# Patient Record
Sex: Female | Born: 1990
Health system: Southern US, Community
[De-identification: ages and names within clinical notes are randomized; demographics above are authoritative.]

## PROBLEM LIST (undated history)

## (undated) DIAGNOSIS — Z1379 Encounter for other screening for genetic and chromosomal anomalies: Principal | ICD-10-CM

## (undated) DIAGNOSIS — L709 Acne, unspecified: Secondary | ICD-10-CM

## (undated) HISTORY — DX: Encounter for other screening for genetic and chromosomal anomalies: Z13.79

## (undated) HISTORY — DX: Acne, unspecified: L70.9

---
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
## 2009-11-05 ENCOUNTER — Encounter: Payer: Self-pay | Admitting: Family Medicine

## 2009-11-06 ENCOUNTER — Encounter (INDEPENDENT_AMBULATORY_CARE_PROVIDER_SITE_OTHER): Payer: Self-pay | Admitting: *Deleted

## 2009-11-06 LAB — CONVERTED CEMR LAB
Albumin: 4.2 g/dL
Alkaline Phosphatase: 44 units/L
B-12, serum: 556 pg/mL
Creatinine, Ser: 0.69 mg/dL
LDL Cholesterol: 149 mg/dL
MCH: 28.6 pg
MCV: 87.6 fL
Platelets: 293 10*3/uL
Potassium, serum: 4 mmol/L
RBC count: 4.2 10*6/uL
Total Protein: 7.3 g/dL
Triglycerides: 91 mg/dL

## 2010-01-24 ENCOUNTER — Encounter (INDEPENDENT_AMBULATORY_CARE_PROVIDER_SITE_OTHER): Payer: Self-pay | Admitting: *Deleted

## 2010-01-24 ENCOUNTER — Ambulatory Visit: Payer: Self-pay | Admitting: Family Medicine

## 2010-01-24 DIAGNOSIS — M25559 Pain in unspecified hip: Secondary | ICD-10-CM | POA: Insufficient documentation

## 2010-01-24 DIAGNOSIS — E663 Overweight: Secondary | ICD-10-CM | POA: Insufficient documentation

## 2010-01-24 DIAGNOSIS — E785 Hyperlipidemia, unspecified: Secondary | ICD-10-CM | POA: Insufficient documentation

## 2010-01-28 ENCOUNTER — Telehealth: Payer: Self-pay | Admitting: Family Medicine

## 2010-01-29 ENCOUNTER — Telehealth (INDEPENDENT_AMBULATORY_CARE_PROVIDER_SITE_OTHER): Payer: Self-pay | Admitting: *Deleted

## 2010-01-29 LAB — CONVERTED CEMR LAB
ALT: 44 units/L — ABNORMAL HIGH (ref 0–35)
Albumin: 3.9 g/dL (ref 3.5–5.2)
Alkaline Phosphatase: 41 units/L (ref 39–117)
Basophils Absolute: 0 10*3/uL (ref 0.0–0.1)
Basophils Relative: 0.4 % (ref 0.0–3.0)
Bilirubin, Direct: 0.1 mg/dL (ref 0.0–0.3)
CO2: 27 meq/L (ref 19–32)
Calcium: 9.2 mg/dL (ref 8.4–10.5)
Chloride: 106 meq/L (ref 96–112)
Cholesterol: 173 mg/dL (ref 0–200)
Creatinine, Ser: 0.8 mg/dL (ref 0.4–1.2)
Eosinophils Relative: 0.8 % (ref 0.0–5.0)
Glucose, Bld: 90 mg/dL (ref 70–99)
HCT: 35.9 % — ABNORMAL LOW (ref 36.0–46.0)
Hemoglobin: 12.4 g/dL (ref 12.0–15.0)
Lymphocytes Relative: 34.5 % (ref 12.0–46.0)
Lymphs Abs: 2.3 10*3/uL (ref 0.7–4.0)
Monocytes Relative: 8.9 % (ref 3.0–12.0)
Neutro Abs: 3.6 10*3/uL (ref 1.4–7.7)
RBC: 4 M/uL (ref 3.87–5.11)
TSH: 3.51 microintl units/mL (ref 0.35–5.50)
VLDL: 12.4 mg/dL (ref 0.0–40.0)
WBC: 6.5 10*3/uL (ref 4.5–10.5)

## 2010-02-06 ENCOUNTER — Ambulatory Visit: Payer: Self-pay | Admitting: Sports Medicine

## 2010-02-12 ENCOUNTER — Ambulatory Visit: Payer: Self-pay | Admitting: Family Medicine

## 2010-02-13 LAB — CONVERTED CEMR LAB
ALT: 20 units/L (ref 0–35)
Albumin: 4.1 g/dL (ref 3.5–5.2)
Bilirubin, Direct: 0.1 mg/dL (ref 0.0–0.3)
Total Protein: 6.8 g/dL (ref 6.0–8.3)

## 2010-02-21 ENCOUNTER — Ambulatory Visit: Payer: Self-pay | Admitting: Family Medicine

## 2010-10-02 NOTE — Assessment & Plan Note (Signed)
Summary: Snapping Hip Syndrome   Vital Signs:  Patient profile:   20 year old female BP sitting:   115 / 82  Vitals Entered By: Lillia Pauls CMA (February 06, 2010 10:14 AM)  Primary Care Provider:  Laury Axon   History of Present Illness: 20 yo F referred by Dr. Beverely Low for R > L anterior hip pain  Patient reports main complaint is  ~2 months of snapping/popping feeling and sound in R > L anterior hips Occasionally is painful in this area Feels mainly when doing leg raises and abdominal workouts Is not currently playing any sports. Occasionally has low back pain but no numbness/tingling No bowel/bladder dysfunction. Not limping because of pain No locking up of her hip.  Allergies (verified): 1)  ! Biaxin  Physical Exam  General:  Well-developed,well-nourished,in no acute distress; alert,appropriate and cooperative throughout examination Msk:  Back: No gross deformity, swelling, bruising. No midline or paraspinal TTP. Negative SLRs bilaterally Strength 5-/5 with hip flexion R, 5/5 on L;  4/5 with hip abduction R/L; 4/5 with sartorius R/L;  otherwise 5/5 strength BLEs other muscle groups Sensation intact to light touch MSRs 2+ and equal in bilateral patellar and achilles tendons.  Hips: No gross deformity. No TTP greater trochanter, area of iliopsoas muscle. FROM bilateral hips. Strength as noted above. Negative logrolls No leg length inequality. Negative Fabers and piriformis stretches. Palpable 'snap' with hip flexion when lying.  Could not reproduce with standing hip rotation exercise.   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  No injury or mechanical symptoms to suggest labral pathology.  Strengthening exercises given and emphasized - pain is not much of an issue for her currently but can try aleve twice a day if it does become more frequent.  Consider PT if worsening despite home exercises.   Reassured regarding the diagnosis and handout provided.  F/u in 6 weeks.  Complete Medication List: 1)  Tri-sprintec 0.035 Mg Tabs (Norgestimate-ethinyl estradiol) 2)  Zyrtec Allergy 10 Mg Tabs (Cetirizine hcl) .Marland Kitchen.. 1 by mouth once daily prn 3)  Ventolin Hfa 108 (90 Base) Mcg/act Aers (Albuterol sulfate) .Marland Kitchen.. 1-2 puffs every 4 hours as needed shortness of breath  Patient Instructions: 1)  You have snapping hip syndrome. 2)  If you are having pain because of this, take aleve 2 tabs twice a day with food. 3)  The major treatment for this is strengthening and stretching. 4)  Straight leg raises 3 sets of 10 once a day - when too easy you can add a 2 pound ankle weight. 5)  Hip rotation exercise 3 sets of 10 also and can add weight if too easy. 6)  You are weak in your hip abductors so do this exercise in the same manner as above. 7)  Follow up with [place] in 6 weeks for a recheck.

## 2010-10-02 NOTE — Miscellaneous (Signed)
Summary: labs from [facility]  Clinical Lists Changes  Observations: Added new observation of B-12: 556 pg/mL (11/06/2009 8:55) Added new observation of TSH: 1.095 microintl units/mL (11/06/2009 8:55) Added new observation of TRIGLYC TOT: 91 mg/dL (53/66/4403 4:74) Added new observation of LDL: 149 mg/dL (25/95/6387 5:64) Added new observation of HDL: 56 mg/dL (33/29/5188 4:16) Added new observation of CHOLESTEROL: 223 mg/dL (60/63/0160 1:09) Added new observation of BILI TOTAL: 0.4 mg/dL (32/35/5732 2:02) Added new observation of ALK PHOS: 44 units/L (11/06/2009 8:55) Added new observation of SGPT (ALT): 16 units/L (11/06/2009 8:55) Added new observation of SGOT (AST): 18 units/L (11/06/2009 8:55) Added new observation of PROTEIN, TOT: 7.3 g/dL (54/27/0623 7:62) Added new observation of ALBUMIN: 4.2 g/dL (83/15/1761 6:07) Added new observation of CALCIUM: 9.0 mg/dL (37/05/6268 4:85) Added new observation of GLUCOSE SER: 82 mg/dL (46/27/0350 0:93) Added new observation of CREATININE: 0.69 mg/dL (81/82/9937 1:69) Added new observation of BUN: 9 mg/dL (67/89/3810 1:75) Added new observation of CO2 TOTAL: 28 mmol/L (11/06/2009 8:55) Added new observation of CHLORIDE: 101 mmol/L (11/06/2009 8:55) Added new observation of POTASSIUM: 4.0 mmol/L (11/06/2009 8:55) Added new observation of SODIUM: 137 mmol/L (11/06/2009 8:55) Added new observation of PLATELETS: 293 10*3/mm3 (11/06/2009 8:55) Added new observation of MCH: 28.6 pg (11/06/2009 8:55) Added new observation of MCV: 87.6 fL (11/06/2009 8:55) Added new observation of HCT: 36.8 % (11/06/2009 8:55) Added new observation of HGB: 12.0 g/dL (06/24/8526 7:82) Added new observation of RBC: 4.20 10*6/mm3 (11/06/2009 8:55) Added new observation of WBC: 6.7 10*3/mm3 (11/06/2009 8:55)

## 2010-10-02 NOTE — Progress Notes (Signed)
Summary: labs-unable to leave msg  Phone Note Outgoing Call   Call placed by: Doristine Devoid,  January 29, 2010 9:33 AM Call placed to: Patient Summary of Call: cholesterol is much improved but liver enzymes high.  hold all tylenol, ETOH, and red yeast rice for 2 weeks and recheck.  rest of labs look good  Follow-up for Phone Call        unable to leave msg will try again later.......Marland KitchenDoristine Devoid  January 29, 2010 9:34 AM      Appended Document: labs-    Phone Note Outgoing Call   Summary of Call: spoke w/ patient mom aware of labs

## 2010-10-02 NOTE — Assessment & Plan Note (Signed)
Summary: rto 4 weeks/cbs   Vital Signs:  Patient profile:   20 year old female Weight:      138 pounds Pulse rate:   80 / minute BP sitting:   108 / 60  (left arm)  Vitals Entered By: Doristine Devoid (February 21, 2010 1:54 PM) CC: f/u on weight loss    History of Present Illness: 20 yo girl here today for f/u on wt loss.  has lost 3 lbs in the last month.  has not been exercising regularly b/c they moved last week.  was working out 4x/week prior to that.  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  realized she is not getting enough fruits and vegetables.  adequate calcium.  pt is not overly concerned about her wt.  Current Medications (verified): 1)  Tri-Sprintec 0.035 Mg  Tabs (Norgestimate-Ethinyl Estradiol) 2)  Zyrtec Allergy 10 Mg  Tabs (Cetirizine Hcl) .Marland Kitchen.. 1 By Mouth Once Daily Prn 3)  Ventolin Hfa 108 (90 Base) Mcg/act  Aers (Albuterol Sulfate) .Marland Kitchen.. 1-2 Puffs Every 4 Hours As Needed Shortness of Breath  Allergies (verified): 1)  ! 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 Alert and cooperative with normal attention span and concentration. No apparent delusions, illusions, hallucinations   Impression & Recommendations:  Problem # 1:  OVERWEIGHT (ICD-278.02) Assessment Unchanged pt has lost 3 lbs w/ regular exercise and healthier food choices.  she is not overly concerned at this time.  has healthy habits for the most part and keeping her food journal is helping her understand her nutritional deficiences.  will follow.  Complete Medication List: 1)  Tri-sprintec 0.035 Mg Tabs (Norgestimate-ethinyl estradiol) 2)  Zyrtec  Allergy 10 Mg Tabs (Cetirizine hcl) .Marland Kitchen.. 1 by mouth once daily prn 3)  Ventolin Hfa 108 (90 Base) Mcg/act Aers (Albuterol sulfate) .Marland Kitchen.. 1-2 puffs every 4 hours as needed shortness of breath  Patient Instructions: 1)  Schedule a follow up as needed 2)  Keep up the good work on exercise and continue to make healthy food choices 3)  Good luck with your classes 4)  Enjoy your summer!

## 2010-10-02 NOTE — Assessment & Plan Note (Signed)
Summary: R  hip popping/cbs   Vital Signs:  Patient profile:   20 year old female Height:      62.5 inches Weight:      141 pounds BMI:     22.40 Pulse rate:   68 / minute BP sitting:   112 / 70  (left arm)  Vitals Entered By: Doristine Devoid (Jan 24, 2010 8:05 AM) CC: R hip popping w/ exercise and recheck cholesterol    History of Present Illness: 20 yo woman here today w/ R hip popping.  sxs started 'several months ago'.  pt can both hear and feel the 'pop'.  located more anteriorly.  doesn't feel like it actually is out of place, more like a 'slipping'.  some discomfort but pt denies a lot of pain.  happens every time pt exercises, can occur while walking or turning over in bed.  hypercholesterolemia- due for recheck.  has been taking red yeast rice since March when elevated cholesterol was identified during school physical.  Overweight- has gained nearly 20 lbs in the 2 yrs since going to college.  pt exercising 2-3x/week for 45 minutes.  eating breakfast regularly, will occasionally skip lunch.  1-2 snacks daily- chips, bananas, oranges.  not drinking ETOH, denies late night eating.  mom concerned for pt's thyroid although normal at march CPE.  Preventive Screening-Counseling & Management  Alcohol-Tobacco     Alcohol drinks/day: 0     Smoking Status: never  Caffeine-Diet-Exercise     Does Patient Exercise: yes      Drug Use:  never.    Current Medications (verified): 1)  Tri-Sprintec 0.035 Mg  Tabs (Norgestimate-Ethinyl Estradiol) 2)  Zyrtec Allergy 10 Mg  Tabs (Cetirizine Hcl) .Marland Kitchen.. 1 By Mouth Once Daily Prn 3)  Ventolin Hfa 108 (90 Base) Mcg/act  Aers (Albuterol Sulfate) .Marland Kitchen.. 1-2 Puffs Every 4 Hours As Needed Shortness of Breath  Allergies (verified): 1)  ! 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 Lungs:  CTA B Heart:  RRR without murmur  Msk:  full ROM of R hip.  no pain w/ flexion/extension/internal or external rotation some asymmetry of SI alignment, ? leg length inequality Pulses:  +2 carotid, radial, DP Extremities:  no CCE Neurologic:  strength normal in all extremities, gait normal, and DTRs symmetrical and normal.     Impression & Recommendations:  Problem # 1:  HIP PAIN (ICD-719.45) Assessment New pt w/out pain on hip exam.  ? leg length inequality vs SI malalignment.  no current pain.  refer to sports med. Orders: Sports Medicine (Sports Med)  Problem # 2:  HYPERLIPIDEMIA (ICD-272.4) Assessment: New labs elevated at recent physical.  has been taking red yeast rice.  due for recheck. Orders: Venipuncture (16109) TLB-Lipid Panel (80061-LIPID) TLB-Hepatic/Liver Function Pnl (80076-HEPATIC)  Problem # 3:  OVERWEIGHT (ICD-278.02) Assessment: New pt upset about current wt.  has been exercising 2x/week.  encouraged her to increase this to at least 4-5x/week.  recommended at least 1 day off.  check thyroid to r/o abnormality.  encouraged pt  to keep food diary to determine where she is stumbling.  will follow. Orders: TLB-TSH (Thyroid Stimulating Hormone) (84443-TSH) TLB-CBC Platelet - w/Differential (85025-CBCD) TLB-BMP (Basic Metabolic Panel-BMET) (80048-METABOL)  Complete Medication List: 1)  Tri-sprintec 0.035 Mg Tabs (Norgestimate-ethinyl estradiol) 2)  Zyrtec Allergy 10 Mg Tabs (Cetirizine hcl) .Marland Kitchen.. 1 by mouth once daily prn 3)  Ventolin Hfa 108 (90 Base) Mcg/act Aers (Albuterol sulfate) .Marland Kitchen.. 1-2 puffs  every 4 hours as needed shortness of breath  Patient Instructions: 1)  Please schedule a follow-up appointment in 3-4 weeks to discuss weight loss.  2)  Try and increase your amount of exercise to 4-5x/week 3)  Keep a food journal- write down EVERYTHING you put in your mouth 4)  Someone will call you with your sports med referral 5)  We'll notify you of your lab results 6)  Have a great summer!!

## 2010-10-02 NOTE — Progress Notes (Signed)
----   Converted from flag ---- ---- 01/28/2010 3:49 PM, Neena Rhymes MD wrote: yes, that's fine  ---- 01/28/2010 2:19 PM, Magdalen Spatz [facility] wrote: Johnny Bridge from Dr. Darrick Penna office just called, says Dr. Darrick Penna will be out for 6 weeks, and is booking into July.  Is it Ok for patient to see Dr. Norton Blizzard? ------------------------------

## 2011-03-13 ENCOUNTER — Encounter: Payer: Self-pay | Admitting: Family Medicine

## 2011-03-23 ENCOUNTER — Ambulatory Visit (INDEPENDENT_AMBULATORY_CARE_PROVIDER_SITE_OTHER): Payer: Managed Care, Other (non HMO) | Admitting: Family Medicine

## 2011-03-23 ENCOUNTER — Encounter: Payer: Self-pay | Admitting: Family Medicine

## 2011-03-23 ENCOUNTER — Other Ambulatory Visit (HOSPITAL_COMMUNITY)
Admission: RE | Admit: 2011-03-23 | Discharge: 2011-03-23 | Disposition: A | Discharge status: Home / Self Care | Payer: Managed Care, Other (non HMO) | Source: Ambulatory Visit | Attending: Family Medicine | Admitting: Family Medicine

## 2011-03-23 DIAGNOSIS — Z0000 Encounter for general adult medical examination without abnormal findings: Secondary | ICD-10-CM

## 2011-03-23 DIAGNOSIS — Z124 Encounter for screening for malignant neoplasm of cervix: Secondary | ICD-10-CM

## 2011-03-23 DIAGNOSIS — Z01419 Encounter for gynecological examination (general) (routine) without abnormal findings: Secondary | ICD-10-CM | POA: Insufficient documentation

## 2011-03-23 MED ORDER — NORGESTIM-ETH ESTRAD TRIPHASIC 0.18/0.215/0.25 MG-35 MCG PO TABS: 1.0000 | ORAL_TABLET | Freq: Every day | ORAL | Status: DC

## 2011-03-23 NOTE — Progress Notes (Signed)
  Subjective:    Patient ID: Stacie Rosario, female    DOB: 04-May-1991, 20 y.o.   MRN: [id number]  HPI Here today for CPE.  No concerns today.   Review of Systems Patient reports no vision/ hearing changes, adenopathy,fever, weight change,  persistant/recurrent hoarseness , swallowing issues, chest pain, palpitations, edema, persistant/recurrent cough, hemoptysis, dyspnea (rest/exertional/paroxysmal nocturnal), gastrointestinal bleeding (melena, rectal bleeding), abdominal pain, significant heartburn, bowel changes, GU symptoms (dysuria, hematuria, incontinence), Gyn symptoms (abnormal  bleeding, pain),  syncope, focal weakness, memory loss, numbness & tingling, skin/hair/nail changes, abnormal bruising or bleeding, anxiety, or depression.     Objective:   Physical Exam  General Appearance:    Alert, cooperative, no distress, appears stated age  Head:    Normocephalic, without obvious abnormality, atraumatic  Eyes:    PERRL, conjunctiva/corneas clear, EOM's intact, fundi    benign, both eyes  Ears:    Normal TM's and external ear canals, both ears  Nose:   Nares normal, septum midline, mucosa normal, no drainage    or sinus tenderness  Throat:   Lips, mucosa, and tongue normal; teeth and gums normal  Neck:   Supple, symmetrical, trachea midline, no adenopathy;    Thyroid: no enlargement/tenderness/nodules  Back:     Symmetric, no curvature, ROM normal, no CVA tenderness  Lungs:     Clear to auscultation bilaterally, respirations unlabored  Chest Wall:    No tenderness or deformity   Heart:    Regular rate and rhythm, S1 and S2 normal, no murmur, rub   or gallop  Breast Exam:    No tenderness, masses, or nipple abnormality  Abdomen:     Soft, non-tender, bowel sounds active all four quadrants,    no masses, no organomegaly  Genitalia:    External genitalia normal, cervix normal in appearance, no CMT, uterus in normal size and position, adnexa w/out mass or tenderness, mucosa pink and  moist, no lesions or discharge present  Rectal:    Normal external appearance  Extremities:   Extremities normal, atraumatic, no cyanosis or edema  Pulses:   2+ and symmetric all extremities  Skin:   Skin color, texture, turgor normal, no rashes or lesions  Lymph nodes:   Cervical, supraclavicular, and axillary nodes normal  Neurologic:   CNII-XII intact, normal strength, sensation and reflexes    throughout          Assessment & Plan:

## 2011-03-23 NOTE — Patient Instructions (Signed)
Follow up in 1 year or as needed Your exam looks great!  Keep up the good work! Call with any questions or concerns Stacie Rosario w/ School!

## 2011-03-23 NOTE — Assessment & Plan Note (Signed)
Pap collected. 

## 2011-03-23 NOTE — Assessment & Plan Note (Signed)
Pt's PE WNL.  Anticipatory guidance provided.  

## 2011-05-15 ENCOUNTER — Encounter: Payer: Self-pay | Admitting: Family Medicine

## 2011-05-15 ENCOUNTER — Ambulatory Visit (INDEPENDENT_AMBULATORY_CARE_PROVIDER_SITE_OTHER): Payer: Managed Care, Other (non HMO) | Admitting: Family Medicine

## 2011-05-15 DIAGNOSIS — R110 Nausea: Secondary | ICD-10-CM | POA: Insufficient documentation

## 2011-05-15 DIAGNOSIS — F411 Generalized anxiety disorder: Secondary | ICD-10-CM

## 2011-05-15 DIAGNOSIS — R109 Unspecified abdominal pain: Secondary | ICD-10-CM

## 2011-05-15 DIAGNOSIS — R002 Palpitations: Secondary | ICD-10-CM

## 2011-05-15 DIAGNOSIS — F419 Anxiety disorder, unspecified: Secondary | ICD-10-CM | POA: Insufficient documentation

## 2011-05-15 LAB — POCT URINALYSIS DIPSTICK
Ketones, UA: NEGATIVE
Leukocytes, UA: NEGATIVE
Protein, UA: NEGATIVE
Spec Grav, UA: 1.01
Urobilinogen, UA: 0.2
pH, UA: 7

## 2011-05-15 LAB — CBC WITH DIFFERENTIAL/PLATELET
Basophils Absolute: 0 10*3/uL (ref 0.0–0.1)
Basophils Relative: 0 % (ref 0–1)
Eosinophils Absolute: 0 10*3/uL (ref 0.0–0.7)
Eosinophils Relative: 0 % (ref 0–5)
HCT: 38.9 % (ref 36.0–46.0)
Hemoglobin: 12.7 g/dL (ref 12.0–15.0)
Lymphocytes Relative: 34 % (ref 12–46)
Lymphs Abs: 2 10*3/uL (ref 0.7–4.0)
MCH: 29.5 pg (ref 26.0–34.0)
MCHC: 32.6 g/dL (ref 30.0–36.0)
MCV: 90.3 fL (ref 78.0–100.0)
Monocytes Absolute: 0.5 10*3/uL (ref 0.1–1.0)
Monocytes Relative: 8 % (ref 3–12)
Neutro Abs: 3.4 10*3/uL (ref 1.7–7.7)
Neutrophils Relative %: 58 % (ref 43–77)
Platelets: 245 10*3/uL (ref 150–400)
RBC: 4.31 MIL/uL (ref 3.87–5.11)
RDW: 13.5 % (ref 11.5–15.5)
WBC: 5.9 10*3/uL (ref 4.0–10.5)

## 2011-05-15 LAB — BASIC METABOLIC PANEL
BUN: 11 mg/dL (ref 6–23)
CO2: 20 mEq/L (ref 19–32)
Calcium: 9.3 mg/dL (ref 8.4–10.5)
Chloride: 103 mEq/L (ref 96–112)
Creat: 0.63 mg/dL (ref 0.50–1.10)
Glucose, Bld: 75 mg/dL (ref 70–99)
Potassium: 4.1 mEq/L (ref 3.5–5.3)
Sodium: 137 mEq/L (ref 135–145)

## 2011-05-15 LAB — TSH: TSH: 1.046 u[IU]/mL (ref 0.350–4.500)

## 2011-05-15 LAB — HEPATIC FUNCTION PANEL
Bilirubin, Direct: 0.1 mg/dL (ref 0.0–0.3)
Total Bilirubin: 0.3 mg/dL (ref 0.3–1.2)

## 2011-05-15 LAB — POCT URINE PREGNANCY: Preg Test, Ur: NEGATIVE

## 2011-05-15 MED ORDER — ESCITALOPRAM OXALATE 10 MG PO TABS: 10.0000 mg | ORAL_TABLET | Freq: Every day | ORAL | Status: DC

## 2011-05-15 MED ORDER — OMEPRAZOLE MAGNESIUM 20 MG PO TBEC: 20.0000 mg | DELAYED_RELEASE_TABLET | Freq: Every day | ORAL | Status: DC

## 2011-05-15 NOTE — Assessment & Plan Note (Signed)
lexapro 10 mg 1 po qd  #30   rto 1 month or sooner prn

## 2011-05-15 NOTE — Progress Notes (Signed)
  Subjective:    Patient ID: Stacie Rosario, female    DOB: April 22, 1991, 20 y.o.   MRN: [id number]  HPI Pt here c/o nausea, insomnia and anxiety.  Pt will feel like her heart is racing and skips a beat.  No uri symptoms.  Nausea is worse at night.     Review of Systems    as above Objective:   Physical Exam  Constitutional: She is oriented to person, place, and time. She appears well-developed and well-nourished.  HENT:  Head: Normocephalic and atraumatic.  Right Ear: External ear normal.  Left Ear: External ear normal.  Nose: Nose normal.  Mouth/Throat: Oropharynx is clear and moist.  Neck: Normal range of motion. Neck supple.  Cardiovascular: Normal rate, regular rhythm and normal heart sounds.   Pulmonary/Chest: Effort normal and breath sounds normal. No respiratory distress. She has no wheezes. She has no rales. She exhibits no tenderness.  Abdominal: Soft. Bowel sounds are normal. She exhibits no distension. There is no tenderness. There is no rebound.  Musculoskeletal: Normal range of motion. She exhibits no edema.  Neurological: She is alert and oriented to person, place, and time.  Skin: Skin is warm and dry.  Psychiatric: She has a normal mood and affect. Her behavior is normal. Judgment and thought content normal. Cognition and memory are normal.       Pt states she has been feeling anxious lately.  It started when school started. She is home for the weekend and feels better.  She was still nauseous yesterday.          Assessment & Plan:

## 2011-05-15 NOTE — Patient Instructions (Signed)
Anxiety and Panic Attacks Your caregiver has informed you that you are having an anxiety or panic attack. There may be many forms of this. Most of the time these attacks come suddenly and without warning. They come at any time of day, including periods of sleep, and at any time of life. They may be strong and unexplained. Although panic attacks are very scary, they are physically harmless. Sometimes the cause of your anxiety is not known. Anxiety is a protective mechanism of the body in its fight or flight mechanism. Most of these perceived danger situations are actually nonphysical situations (such as anxiety over losing a job). CAUSES The causes of an anxiety or panic attack are many. Panic attacks may occur in otherwise healthy people given a certain set of circumstances. There may be a genetic cause for panic attacks. Some medications may also have anxiety as a side effect. SYMPTOMS Some of the most common feelings are:  Intense terror.  Dizziness, feeling faint.   Hot and cold flashes.   Fear of going crazy.   Feelings that nothing is real.   Sweating.   Shaking.   Chest pain or a fast heartbeat (palpitations).  Smothering, choking sensations.   Feelings of impending doom and that death is near.   Tingling of extremities, this may be from over breathing.   Altered reality (derealization).   Being detached from yourself (depersonalization).   Several symptoms can be present to make up anxiety or panic attacks. DIAGNOSIS The evaluation by your caregiver will depend on the type of symptoms you are experiencing. The diagnosis of anxiety or pain attack is made when no physical illness can be determined to be a cause of the symptoms. TREATMENT Treatment to prevent anxiety and panic attacks may include:  Avoidance of circumstances that cause anxiety.   Reassurance and relaxation.   Regular exercise.   Relaxation therapies, such as yoga.   Psychotherapy with a psychiatrist  or therapist.   Avoidance of caffeine, alcohol and illegal drugs.   Prescribed medication.  SEEK IMMEDIATE MEDICAL CARE IF:  You experience panic attack symptoms that are different than your usual symptoms.   You have any worsening or concerning symptoms.  Document Released: 08/17/2005 Document Re-Released: 02/04/2010 [employer] Patient Information 2011 [employer]. 

## 2011-05-15 NOTE — Assessment & Plan Note (Signed)
prilosec otc daily May be related to anxiety

## 2011-05-18 ENCOUNTER — Telehealth: Payer: Self-pay

## 2011-05-18 MED ORDER — ALPRAZOLAM 0.25 MG PO TABS: 0.2500 mg | ORAL_TABLET | Freq: Three times a day (TID) | ORAL | Status: DC | PRN

## 2011-05-18 NOTE — Telephone Encounter (Signed)
Xanax  0.25 mg  1 po tid prn ---#30  0 refills

## 2011-05-18 NOTE — Telephone Encounter (Signed)
Rx faxed--detailed message left advising patient that this is an prn needed medication only      KP

## 2011-05-18 NOTE — Telephone Encounter (Signed)
Call from [place] and she stated patient was started on Lexapro and she was told it would take 2 weeks to 2 mos to take affect and the patient was told to call if she needed something temporary to help her through. She stated she would like to go ahead and get an RX for something temporary until the Lexaparo kicks in. Please advise     KP

## 2011-07-22 ENCOUNTER — Ambulatory Visit (INDEPENDENT_AMBULATORY_CARE_PROVIDER_SITE_OTHER): Payer: Managed Care, Other (non HMO) | Admitting: Internal Medicine

## 2011-07-22 ENCOUNTER — Encounter: Payer: Self-pay | Admitting: Internal Medicine

## 2011-07-22 ENCOUNTER — Ambulatory Visit: Payer: Managed Care, Other (non HMO) | Admitting: Internal Medicine

## 2011-07-22 VITALS — BP 102/60 | HR 83 | Temp 97.9°F | Resp 16 | Ht 62.0 in | Wt 136.0 lb

## 2011-07-22 DIAGNOSIS — R110 Nausea: Secondary | ICD-10-CM

## 2011-07-22 DIAGNOSIS — M549 Dorsalgia, unspecified: Secondary | ICD-10-CM

## 2011-07-22 NOTE — Assessment & Plan Note (Signed)
Change omeprazole to nexium 40mg  po qd-samples given. Schedule f/u. Consider ruq [place] if no improvement

## 2011-07-22 NOTE — Progress Notes (Signed)
  Subjective:    Patient ID: Stacie Rosario, female    DOB: [birth year], 20 y.o.   MRN: [id number]  HPI Pt presents to clinic for evaluation of nausea. Notes several month h/o intermittent nausea not associated with abdominal pain or emesis. Has attempted low dose ppi with some possible improvement. Nausea typically occurs post-prandially. Reviewed nl cbc, chem7 and lft 9/12. No other alleviating or exacerbating factors. Also notes 2wk h/o bilateral LBP with intermittent radiating to bilateral anterior thighs. No paresthesia, injury/trauma or leg weakness. Attempted otc nsaids and heating pad.   Past Medical History  Diagnosis Date  . Acne   . Allergic rhinitis   . Asthma    No past surgical history on file.  reports that she has never smoked. She has never used smokeless tobacco. She reports that she does not drink alcohol or use illicit drugs. family history includes Arthritis in her father; Depression in her mother; and Thyroid disease in her mother. Allergies  Allergen Reactions  . Clarithromycin      Review of Systems see hpi     Objective:   Physical Exam  Nursing note and vitals reviewed. Constitutional: She appears well-developed and well-nourished.  HENT:  Head: Normocephalic and atraumatic.  Right Ear: External ear normal.  Left Ear: External ear normal.  Eyes: Conjunctivae are normal. No scleral icterus.  Abdominal: Soft. Normal appearance and bowel sounds are normal. She exhibits no distension and no mass. There is no hepatosplenomegaly. There is no tenderness. There is no rebound and no guarding.  Musculoskeletal:       No midline ls tenderness or bony abn. No obvious paraspinal muscle spasm. Bilateral le strength 5/5. SLR neg bilaterally  Neurological: She is alert.  Skin: Skin is warm and dry.  Psychiatric: She has a normal mood and affect.          Assessment & Plan:

## 2011-07-22 NOTE — Assessment & Plan Note (Signed)
Appears to be msk etiology. Avoid nsaids currently with possible gastric etiology of nausea. Recommend PT and pt currently defers due to schedule. Will call back for referral if changes mind

## 2011-07-27 ENCOUNTER — Encounter: Payer: Self-pay | Admitting: Internal Medicine

## 2011-07-27 ENCOUNTER — Ambulatory Visit (INDEPENDENT_AMBULATORY_CARE_PROVIDER_SITE_OTHER): Payer: Managed Care, Other (non HMO) | Admitting: Internal Medicine

## 2011-07-27 VITALS — BP 100/60 | HR 85 | Temp 98.0°F | Resp 18 | Wt 135.0 lb

## 2011-07-27 DIAGNOSIS — R109 Unspecified abdominal pain: Secondary | ICD-10-CM

## 2011-07-27 DIAGNOSIS — R110 Nausea: Secondary | ICD-10-CM

## 2011-07-27 DIAGNOSIS — R112 Nausea with vomiting, unspecified: Secondary | ICD-10-CM

## 2011-07-27 LAB — CBC WITH DIFFERENTIAL/PLATELET
Basophils Absolute: 0 10*3/uL (ref 0.0–0.1)
HCT: 40.7 % (ref 36.0–46.0)
Hemoglobin: 13.6 g/dL (ref 12.0–15.0)
Lymphocytes Relative: 34 % (ref 12–46)
Lymphs Abs: 1.8 10*3/uL (ref 0.7–4.0)
Monocytes Absolute: 0.3 10*3/uL (ref 0.1–1.0)
Monocytes Relative: 6 % (ref 3–12)
Neutro Abs: 3.2 10*3/uL (ref 1.7–7.7)
RBC: 4.56 MIL/uL (ref 3.87–5.11)
RDW: 13.2 % (ref 11.5–15.5)
WBC: 5.4 10*3/uL (ref 4.0–10.5)

## 2011-07-27 LAB — HEPATIC FUNCTION PANEL
ALT: 21 U/L (ref 0–35)
AST: 19 U/L (ref 0–37)
Bilirubin, Direct: 0.1 mg/dL (ref 0.0–0.3)
Indirect Bilirubin: 0.3 mg/dL (ref 0.0–0.9)

## 2011-07-27 LAB — BASIC METABOLIC PANEL
BUN: 10 mg/dL (ref 6–23)
Chloride: 102 mEq/L (ref 96–112)
Potassium: 4.1 mEq/L (ref 3.5–5.3)

## 2011-07-27 LAB — AMYLASE: Amylase: 60 U/L (ref 0–105)

## 2011-07-27 MED ORDER — ONDANSETRON HCL 4 MG PO TABS: 4.0000 mg | ORAL_TABLET | Freq: Three times a day (TID) | ORAL | Status: AC | PRN

## 2011-07-27 NOTE — Progress Notes (Signed)
  Subjective:    Patient ID: Stacie Rosario, female    DOB: 12/13/1990, 20 y.o.   MRN: [id number]  HPI Pt presents to clinic for evaluation of N/V. Developed emesis without hematemesis since last week. Chronic intermitent nausea persists. No change in bowel habits. Sx's may worsen post prandially. Attempted nexium samples after last visit. Stopped temporarily after emesis began. No abdominal pain. No other alleviating or exacerbating factors.  Past Medical History  Diagnosis Date  . Acne   . Allergic rhinitis   . Asthma    No past surgical history on file.  reports that she has never smoked. She has never used smokeless tobacco. She reports that she does not drink alcohol or use illicit drugs. family history includes Arthritis in her father; Depression in her mother; and Thyroid disease in her mother. Allergies  Allergen Reactions  . Clarithromycin        Review of Systems see hpi     Objective:   Physical Exam  Nursing note and vitals reviewed. Constitutional: She appears well-developed and well-nourished. No distress.  HENT:  Head: Normocephalic and atraumatic.  Eyes: Conjunctivae are normal. No scleral icterus.  Abdominal: Soft. Normal appearance and bowel sounds are normal. She exhibits no distension and no mass. There is no hepatosplenomegaly. There is tenderness in the right upper quadrant. There is no rebound, no guarding and negative Murphy's sign.       Slight ruq tenderness  Neurological: She is alert.  Skin: Skin is warm and dry. She is not diaphoretic.  Psychiatric: She has a normal mood and affect.          Assessment & Plan:

## 2011-07-28 ENCOUNTER — Ambulatory Visit (HOSPITAL_BASED_OUTPATIENT_CLINIC_OR_DEPARTMENT_OTHER)
Admission: RE | Admit: 2011-07-28 | Discharge: 2011-07-28 | Disposition: A | Discharge status: Home / Self Care | Payer: Managed Care, Other (non HMO) | Source: Ambulatory Visit | Attending: Internal Medicine | Admitting: Internal Medicine

## 2011-07-28 DIAGNOSIS — R10811 Right upper quadrant abdominal tenderness: Secondary | ICD-10-CM

## 2011-07-28 DIAGNOSIS — R112 Nausea with vomiting, unspecified: Secondary | ICD-10-CM

## 2011-07-28 DIAGNOSIS — R109 Unspecified abdominal pain: Secondary | ICD-10-CM

## 2011-07-28 NOTE — Assessment & Plan Note (Signed)
Now with emesis and mild RUQ tenderness. Obtain cbc, chem7, lft, amylase, lipase and urine hcg. Schedule RUQ [place]. Attempt zofran prn. Consider gi consult if evaluation unrevealing.

## 2011-07-29 ENCOUNTER — Other Ambulatory Visit: Payer: Self-pay | Admitting: Internal Medicine

## 2011-07-29 DIAGNOSIS — R110 Nausea: Secondary | ICD-10-CM

## 2011-07-30 ENCOUNTER — Encounter: Payer: Self-pay | Admitting: Gastroenterology

## 2011-08-03 ENCOUNTER — Encounter: Payer: Self-pay | Admitting: Internal Medicine

## 2011-08-05 ENCOUNTER — Ambulatory Visit: Payer: Managed Care, Other (non HMO) | Admitting: Gastroenterology

## 2012-03-04 ENCOUNTER — Ambulatory Visit: Payer: Managed Care, Other (non HMO) | Admitting: Family Medicine

## 2012-03-23 ENCOUNTER — Ambulatory Visit (INDEPENDENT_AMBULATORY_CARE_PROVIDER_SITE_OTHER): Payer: BC Managed Care – PPO | Admitting: Family Medicine

## 2012-03-23 ENCOUNTER — Other Ambulatory Visit (HOSPITAL_COMMUNITY)
Admission: RE | Admit: 2012-03-23 | Discharge: 2012-03-23 | Disposition: A | Discharge status: Home / Self Care | Payer: BC Managed Care – PPO | Source: Ambulatory Visit | Attending: Family Medicine | Admitting: Family Medicine

## 2012-03-23 ENCOUNTER — Encounter: Payer: Self-pay | Admitting: Family Medicine

## 2012-03-23 VITALS — BP 120/78 | HR 84 | Temp 98.2°F | Ht 62.25 in | Wt 156.8 lb

## 2012-03-23 DIAGNOSIS — M25539 Pain in unspecified wrist: Secondary | ICD-10-CM

## 2012-03-23 DIAGNOSIS — Z01419 Encounter for gynecological examination (general) (routine) without abnormal findings: Secondary | ICD-10-CM | POA: Insufficient documentation

## 2012-03-23 DIAGNOSIS — Z124 Encounter for screening for malignant neoplasm of cervix: Secondary | ICD-10-CM

## 2012-03-23 MED ORDER — NORGESTIM-ETH ESTRAD TRIPHASIC 0.18/0.215/0.25 MG-35 MCG PO TABS: 1.0000 | ORAL_TABLET | Freq: Every day | ORAL | Status: DC

## 2012-03-23 MED ORDER — TRIAMCINOLONE ACETONIDE 0.1 % EX OINT: TOPICAL_OINTMENT | Freq: Two times a day (BID) | CUTANEOUS | Status: AC

## 2012-03-23 NOTE — Progress Notes (Signed)
  Subjective:    Patient ID: Stacie Rosario, female    DOB: 01-16-1991, 21 y.o.   MRN: [id number]  HPI CPE.  R wrist pain- intermittent for 'as long as i can remember'.  Will wear OTC brace when she has pain and sxs will improve.  But over last 2 yrs is having sxs more frequently and at least once monthly is having to wear brace and when she removes brace, unable to move wrist due to pain.  Pain w/ both flexion and extension, also w/ palpation of carpal bones.  No relief w/ tylenol or ibuprofen.   Review of Systems Patient reports no vision/ hearing changes, adenopathy,fever, weight change,  persistant/recurrent hoarseness , swallowing issues, chest pain, palpitations, edema, persistant/recurrent cough, hemoptysis, dyspnea (rest/exertional/paroxysmal nocturnal), gastrointestinal bleeding (melena, rectal bleeding), abdominal pain, significant heartburn, bowel changes, GU symptoms (dysuria, hematuria, incontinence), Gyn symptoms (abnormal  bleeding, pain),  syncope, focal weakness, memory loss, numbness & tingling, skin/hair/nail changes, abnormal bruising or bleeding, anxiety, or depression.     Objective:   Physical Exam  General Appearance:    Alert, cooperative, no distress, appears stated age  Head:    Normocephalic, without obvious abnormality, atraumatic  Eyes:    PERRL, conjunctiva/corneas clear, EOM's intact, fundi    benign, both eyes  Ears:    Normal TM's and external ear canals, both ears  Nose:   Nares normal, septum midline, mucosa normal, no drainage    or sinus tenderness  Throat:   Lips, mucosa, and tongue normal; teeth and gums normal  Neck:   Supple, symmetrical, trachea midline, no adenopathy;    Thyroid: no enlargement/tenderness/nodules  Back:     Symmetric, no curvature, ROM normal, no CVA tenderness  Lungs:     Clear to auscultation bilaterally, respirations unlabored  Chest Wall:    No tenderness or deformity   Heart:    Regular rate and rhythm, S1 and S2 normal, no  murmur, rub   or gallop  Breast Exam:    No tenderness, masses, or nipple abnormality  Abdomen:     Soft, non-tender, bowel sounds active all four quadrants,    no masses, no organomegaly  Genitalia:    External genitalia normal, cervix normal in appearance, no CMT, uterus in normal size and position, adnexa w/out mass or tenderness, mucosa pink and moist, no lesions or discharge present  Rectal:    Normal external appearance  Extremities:   Extremities normal, no cyanosis or edema.  R wrist in brace  Pulses:   2+ and symmetric all extremities  Skin:   Skin color, texture, turgor normal, eczematous patch on R elbow  Lymph nodes:   Cervical, supraclavicular, and axillary nodes normal  Neurologic:   CNII-XII intact, normal strength, sensation and reflexes    throughout          Assessment & Plan:

## 2012-03-23 NOTE — Patient Instructions (Signed)
Follow up in 1 year or as needed We'll notify you of your ortho appt We'll notify you of your lab results Use the Triamcinolone ointment on your elbow twice daily Call with any questions or concerns Have a great summer!

## 2012-03-24 LAB — CBC WITH DIFFERENTIAL/PLATELET
Basophils Absolute: 0 10*3/uL (ref 0.0–0.1)
Eosinophils Absolute: 0 10*3/uL (ref 0.0–0.7)
HCT: 37.5 % (ref 36.0–46.0)
Hemoglobin: 12.6 g/dL (ref 12.0–15.0)
Lymphs Abs: 1.8 10*3/uL (ref 0.7–4.0)
MCHC: 33.6 g/dL (ref 30.0–36.0)
Monocytes Absolute: 0.4 10*3/uL (ref 0.1–1.0)
Neutro Abs: 4.5 10*3/uL (ref 1.4–7.7)
RDW: 13.8 % (ref 11.5–14.6)

## 2012-03-24 LAB — BASIC METABOLIC PANEL
Calcium: 9.5 mg/dL (ref 8.4–10.5)
Creatinine, Ser: 0.7 mg/dL (ref 0.4–1.2)

## 2012-03-24 LAB — LIPID PANEL
Cholesterol: 198 mg/dL (ref 0–200)
Triglycerides: 99 mg/dL (ref 0.0–149.0)

## 2012-03-24 LAB — HEPATIC FUNCTION PANEL
Bilirubin, Direct: 0 mg/dL (ref 0.0–0.3)
Total Bilirubin: 0.4 mg/dL (ref 0.3–1.2)

## 2012-03-27 LAB — VITAMIN D 1,25 DIHYDROXY
Vitamin D2 1, 25 (OH)2: <8 pg/mL
Vitamin D3 1, 25 (OH)2: 79 pg/mL

## 2012-03-27 NOTE — Assessment & Plan Note (Signed)
Pt's PE WNL w/ exception of R wrist pain and eczema.  Start steroid cream for skin patches.  Check labs.  Anticipatory guidance provided.

## 2012-03-27 NOTE — Assessment & Plan Note (Signed)
New.  Continue NSAIDs, brace prn.  Refer to ortho.  Pt expressed understanding and is in agreement w/ plan.

## 2012-03-27 NOTE — Assessment & Plan Note (Signed)
Pap collected. 

## 2012-03-28 ENCOUNTER — Encounter: Payer: Self-pay | Admitting: *Deleted

## 2012-08-10 ENCOUNTER — Encounter: Payer: Self-pay | Admitting: Family Medicine

## 2012-08-10 ENCOUNTER — Ambulatory Visit (INDEPENDENT_AMBULATORY_CARE_PROVIDER_SITE_OTHER): Payer: BC Managed Care – PPO | Admitting: Family Medicine

## 2012-08-10 VITALS — BP 110/80 | HR 75 | Temp 97.6°F | Ht 62.25 in | Wt 150.0 lb

## 2012-08-10 DIAGNOSIS — G2581 Restless legs syndrome: Secondary | ICD-10-CM

## 2012-08-10 MED ORDER — ALPRAZOLAM 0.25 MG PO TABS: 0.2500 mg | ORAL_TABLET | Freq: Three times a day (TID) | ORAL | Status: DC | PRN

## 2012-08-10 MED ORDER — ROPINIROLE HCL 0.25 MG PO TABS: ORAL_TABLET | ORAL | Status: DC

## 2012-08-10 NOTE — Patient Instructions (Addendum)
Follow up in 1 month to see if symptoms have improved- we can adjust the dose as needed Start the Requip nightly Use the xanax only as needed for severe anxiety Call with any questions or concerns Happy Holidays!!

## 2012-08-10 NOTE — Progress Notes (Signed)
  Subjective:    Patient ID: Stacie Rosario, female    DOB: 01-Sep-1990, 21 y.o.   MRN: [id number]  HPI RLS- pt reports intermittent sxs x2 yrs but has progressively worsened x6 months.  Occuring nightly- has overwhelming need to move legs, 'it feels like i need to get up and run a mile'.  Feeling will get so intense that 3-4x/week will get pain in hips and thighs.  Will try activity and stretching w/out relief.  No relief w/ tylenol.  No pain or movement of arms.  No dizziness.  Pain free during the day.   Review of Systems For ROS see HPI     Objective:   Physical Exam  Vitals reviewed. Constitutional: She is oriented to person, place, and time. She appears well-developed and well-nourished. No distress.  Musculoskeletal: She exhibits no edema and no tenderness (no TTP over multiple trigger points).       Negative SLR bilaterally Normal hip flexion, extension, IR/ER bilaterally  Neurological: She is alert and oriented to person, place, and time. She has normal reflexes. No cranial nerve deficit. Coordination normal.  Skin: Skin is warm and dry.  Psychiatric: She has a normal mood and affect. Her behavior is normal.          Assessment & Plan:

## 2012-08-10 NOTE — Assessment & Plan Note (Signed)
New to provider, ongoing for pt but recently worsened (high stress- mom recently dx'd w/ breast cancer).  Start low dose Requip.  Follow closely.

## 2012-09-12 ENCOUNTER — Encounter: Payer: Self-pay | Admitting: Lab

## 2012-09-13 ENCOUNTER — Encounter: Payer: Self-pay | Admitting: Family Medicine

## 2012-09-13 ENCOUNTER — Ambulatory Visit (INDEPENDENT_AMBULATORY_CARE_PROVIDER_SITE_OTHER): Payer: Managed Care, Other (non HMO) | Admitting: Family Medicine

## 2012-09-13 VITALS — BP 110/80 | HR 78 | Temp 98.3°F | Ht 62.5 in | Wt 148.8 lb

## 2012-09-13 DIAGNOSIS — F419 Anxiety disorder, unspecified: Secondary | ICD-10-CM

## 2012-09-13 DIAGNOSIS — F411 Generalized anxiety disorder: Secondary | ICD-10-CM

## 2012-09-13 DIAGNOSIS — G2581 Restless legs syndrome: Secondary | ICD-10-CM

## 2012-09-13 MED ORDER — ROPINIROLE HCL 0.5 MG PO TABS: ORAL_TABLET | ORAL | Status: DC

## 2012-09-13 NOTE — Progress Notes (Signed)
  Subjective:    Patient ID: Stacie Rosario, female    DOB: Jan 20, 1991, 22 y.o.   MRN: [id number]  HPI RLS- started on low dose Requip at last visit.  sxs have improved but no resolved.  Sleeping better.  Initially had nausea but this improved.  Anxiety- 'it's ok'.  Still very stressed about mom's breast cancer.  Has only used 3-4 xanax since last visit.  Pt not interested in a daily controller med.  Would like to continue xanax as needed.   Review of Systems For ROS see HPI     Objective:   Physical Exam  Vitals reviewed. Constitutional: She is oriented to person, place, and time. She appears well-developed and well-nourished. No distress.  Neurological: She is alert and oriented to person, place, and time.  Psychiatric: She has a normal mood and affect. Her behavior is normal. Judgment and thought content normal.          Assessment & Plan:

## 2012-09-13 NOTE — Patient Instructions (Addendum)
Increase the Requip to 0.5 mg nightly (2 of what you have at home and 1 of the new script) Continue the xanax as needed Call with any questions or concerns Hang in there!!!

## 2012-09-13 NOTE — Assessment & Plan Note (Signed)
Unchanged.  Pt requiring very little xanax at this time.  Prefers not to take daily controller med.  Will continue to follow.

## 2012-09-13 NOTE — Assessment & Plan Note (Signed)
Improved.  Pt would like to titrate dose and see if sxs resolve.  Will follow.

## 2012-10-15 ENCOUNTER — Other Ambulatory Visit: Payer: Self-pay

## 2012-10-17 ENCOUNTER — Telehealth: Payer: Self-pay | Admitting: Family Medicine

## 2012-10-17 NOTE — Telephone Encounter (Signed)
Refill: Alprazolam 0.25 mg tablets. Take 1 tablet by mouth three times daily as needed for anxiety. Qty 30. Last fill 08-11-12

## 2012-10-18 MED ORDER — ALPRAZOLAM 0.25 MG PO TABS: 0.2500 mg | ORAL_TABLET | Freq: Three times a day (TID) | ORAL | Status: DC | PRN

## 2012-10-18 NOTE — Telephone Encounter (Signed)
Please advise on refill request.  Last OV:09-13-12.//AB/CMA

## 2012-10-18 NOTE — Telephone Encounter (Signed)
Ok for #30, 1 refill 

## 2012-10-18 NOTE — Telephone Encounter (Signed)
Rx printed and faxed to the pharmacy([facility] [place]).//AB/CMA

## 2012-11-24 ENCOUNTER — Encounter: Payer: Self-pay | Admitting: Family Medicine

## 2012-11-24 ENCOUNTER — Ambulatory Visit (INDEPENDENT_AMBULATORY_CARE_PROVIDER_SITE_OTHER): Payer: Managed Care, Other (non HMO) | Admitting: Family Medicine

## 2012-11-24 VITALS — BP 100/70 | HR 98 | Temp 98.3°F | Ht 62.5 in | Wt 149.6 lb

## 2012-11-24 DIAGNOSIS — J069 Acute upper respiratory infection, unspecified: Secondary | ICD-10-CM

## 2012-11-24 DIAGNOSIS — J309 Allergic rhinitis, unspecified: Secondary | ICD-10-CM | POA: Insufficient documentation

## 2012-11-24 MED ORDER — PROMETHAZINE-DM 6.25-15 MG/5ML PO SYRP: 5.0000 mL | ORAL_SOLUTION | Freq: Four times a day (QID) | ORAL | Status: DC | PRN

## 2012-11-24 NOTE — Progress Notes (Signed)
  Subjective:    Patient ID: Stacie Rosario, female    DOB: 1991/01/04, 22 y.o.   MRN: [id number]  HPI URI- sxs started Sunday, 'i felt a little off'.  Monday had N/V.  Tuesday had sore throat and PND.  Now w/ cough- dry, barking.  Last night was again vomiting.  Got SOB, HA.  Copious PND is causing pt to gag.  Went to UC yesterday- no fever.  Left w/out dx but w/ script for Zofran.  No facial pain/pressure.   Review of Systems For ROS see HPI     Objective:   Physical Exam  Vitals reviewed. Constitutional: She is oriented to person, place, and time. She appears well-developed and well-nourished. No distress.  HENT:  Head: Normocephalic and atraumatic.  Right Ear: Tympanic membrane normal.  Left Ear: Tympanic membrane normal.  Nose: Mucosal edema and rhinorrhea present. Right sinus exhibits no maxillary sinus tenderness and no frontal sinus tenderness. Left sinus exhibits no maxillary sinus tenderness and no frontal sinus tenderness.  Mouth/Throat: Mucous membranes are normal. Posterior oropharyngeal erythema (w/ PND) present.  Eyes: Conjunctivae and EOM are normal. Pupils are equal, round, and reactive to light.  Neck: Normal range of motion. Neck supple.  Cardiovascular: Normal rate, regular rhythm and normal heart sounds.   Pulmonary/Chest: Effort normal and breath sounds normal. No respiratory distress. She has no wheezes. She has no rales.  Abdominal: Soft. She exhibits no distension. There is no tenderness. There is no rebound and no guarding.  Lymphadenopathy:    She has no cervical adenopathy.  Neurological: She is alert and oriented to person, place, and time.  Skin: Skin is warm and dry.          Assessment & Plan:

## 2012-11-24 NOTE — Patient Instructions (Addendum)
This is a viral/allergy combo Continue the Allegra Restart the nasal spray- 2 sprays each nostril daily Use the cough syrup as needed- will cause drowsiness Add Sudafed x3 days (as directed on package) to dry up congestion and post-nasal drip Drink plenty of fluids REST! Hang in there!!

## 2012-11-24 NOTE — Assessment & Plan Note (Signed)
Pt's sxs and PE consistent w/ viral illness- no obvious bacterial infxn present.  No need for abx.  Reviewed supportive care and red flags that should prompt return.  Pt expressed understanding and is in agreement w/ plan.

## 2012-11-24 NOTE — Assessment & Plan Note (Addendum)
New.  Pt on allegra.  Has nasal spray at home- instructed to restart.  Start short term decongestant.  Reviewed supportive care and red flags that should prompt return.  Pt expressed understanding and is in agreement w/ plan.

## 2013-02-15 ENCOUNTER — Telehealth: Payer: Self-pay | Admitting: Family Medicine

## 2013-02-15 NOTE — Telephone Encounter (Signed)
Pt wanted to know could we refill her birth control medication until her appt on 04/17/2013 Norgestimate-Ethinyl Estradiol Triphasic (TRI-SPRINTEC) 0.18/0.215/0.25 MG-35 MCG tablet   Pharmacy: [id number] - [place], [place] - [address]

## 2013-02-16 MED ORDER — NORGESTIM-ETH ESTRAD TRIPHASIC 0.18/0.215/0.25 MG-35 MCG PO TABS: 1.0000 | ORAL_TABLET | Freq: Every day | ORAL | Status: DC

## 2013-02-16 NOTE — Telephone Encounter (Signed)
Rx sent to the pharmacy by e-script and LM @ (12:07pm) informing the pt that the rx was refilled.//AB/CMA

## 2013-04-14 ENCOUNTER — Encounter: Payer: Managed Care, Other (non HMO) | Admitting: Family Medicine

## 2013-04-16 ENCOUNTER — Other Ambulatory Visit: Payer: Self-pay | Admitting: Family Medicine

## 2013-04-17 ENCOUNTER — Other Ambulatory Visit (HOSPITAL_COMMUNITY)
Admission: RE | Admit: 2013-04-17 | Discharge: 2013-04-17 | Disposition: A | Discharge status: Home / Self Care | Payer: Managed Care, Other (non HMO) | Source: Ambulatory Visit | Attending: Family Medicine | Admitting: Family Medicine

## 2013-04-17 ENCOUNTER — Encounter: Payer: Self-pay | Admitting: Family Medicine

## 2013-04-17 ENCOUNTER — Ambulatory Visit (INDEPENDENT_AMBULATORY_CARE_PROVIDER_SITE_OTHER): Payer: Managed Care, Other (non HMO) | Admitting: Family Medicine

## 2013-04-17 VITALS — BP 110/74 | HR 85 | Temp 97.4°F | Ht 62.5 in | Wt 152.8 lb

## 2013-04-17 DIAGNOSIS — Z124 Encounter for screening for malignant neoplasm of cervix: Secondary | ICD-10-CM

## 2013-04-17 DIAGNOSIS — Z1331 Encounter for screening for depression: Secondary | ICD-10-CM

## 2013-04-17 DIAGNOSIS — Z113 Encounter for screening for infections with a predominantly sexual mode of transmission: Secondary | ICD-10-CM | POA: Insufficient documentation

## 2013-04-17 DIAGNOSIS — Z01419 Encounter for gynecological examination (general) (routine) without abnormal findings: Secondary | ICD-10-CM | POA: Insufficient documentation

## 2013-04-17 DIAGNOSIS — Z0000 Encounter for general adult medical examination without abnormal findings: Secondary | ICD-10-CM

## 2013-04-17 DIAGNOSIS — Z1151 Encounter for screening for human papillomavirus (HPV): Secondary | ICD-10-CM | POA: Insufficient documentation

## 2013-04-17 LAB — BASIC METABOLIC PANEL WITH GFR
BUN: 10 mg/dL (ref 6–23)
CO2: 26 meq/L (ref 19–32)
Calcium: 9.4 mg/dL (ref 8.4–10.5)
Chloride: 102 meq/L (ref 96–112)
Creatinine, Ser: 0.8 mg/dL (ref 0.4–1.2)
GFR: 92.89 mL/min
Glucose, Bld: 86 mg/dL (ref 70–99)
Potassium: 3.7 meq/L (ref 3.5–5.1)
Sodium: 135 meq/L (ref 135–145)

## 2013-04-17 LAB — CBC WITH DIFFERENTIAL/PLATELET
Basophils Absolute: 0 10*3/uL (ref 0.0–0.1)
Basophils Relative: 0.5 % (ref 0.0–3.0)
Eosinophils Absolute: 0 10*3/uL (ref 0.0–0.7)
Eosinophils Relative: 0.1 % (ref 0.0–5.0)
HCT: 39.3 % (ref 36.0–46.0)
Hemoglobin: 13.3 g/dL (ref 12.0–15.0)
Lymphocytes Relative: 24.8 % (ref 12.0–46.0)
Lymphs Abs: 1.5 10*3/uL (ref 0.7–4.0)
MCHC: 33.9 g/dL (ref 30.0–36.0)
MCV: 88.1 fl (ref 78.0–100.0)
Monocytes Absolute: 0.3 10*3/uL (ref 0.1–1.0)
Monocytes Relative: 5 % (ref 3.0–12.0)
Neutro Abs: 4.3 10*3/uL (ref 1.4–7.7)
Neutrophils Relative %: 69.6 % (ref 43.0–77.0)
Platelets: 269 10*3/uL (ref 150.0–400.0)
RBC: 4.47 Mil/uL (ref 3.87–5.11)
RDW: 14.1 % (ref 11.5–14.6)
WBC: 6.2 10*3/uL (ref 4.5–10.5)

## 2013-04-17 LAB — LIPID PANEL
Cholesterol: 218 mg/dL — ABNORMAL HIGH (ref 0–200)
HDL: 54.7 mg/dL (ref >39.00)
Total CHOL/HDL Ratio: 4
Triglycerides: 98 mg/dL (ref 0.0–149.0)

## 2013-04-17 LAB — HEPATIC FUNCTION PANEL
Alkaline Phosphatase: 38 U/L — ABNORMAL LOW (ref 39–117)
Bilirubin, Direct: 0 mg/dL (ref 0.0–0.3)
Total Bilirubin: 0.3 mg/dL (ref 0.3–1.2)
Total Protein: 7.6 g/dL (ref 6.0–8.3)

## 2013-04-17 LAB — LDL CHOLESTEROL, DIRECT: Direct LDL: 151.7 mg/dL

## 2013-04-17 MED ORDER — PROMETHAZINE HCL 25 MG PO TABS: 25.0000 mg | ORAL_TABLET | Freq: Four times a day (QID) | ORAL | Status: DC | PRN

## 2013-04-17 NOTE — Progress Notes (Signed)
  Subjective:    Patient ID: Stacie Rosario, female    DOB: Sep 10, 1990, 22 y.o.   MRN: [id number]  HPI CPE- no concerns today.   Review of Systems Patient reports no vision/ hearing changes, adenopathy,fever, weight change,  persistant/recurrent hoarseness , swallowing issues, chest pain, palpitations, edema, persistant/recurrent cough, hemoptysis, dyspnea (rest/exertional/paroxysmal nocturnal), gastrointestinal bleeding (melena, rectal bleeding), abdominal pain, significant heartburn, bowel changes, GU symptoms (dysuria, hematuria, incontinence), Gyn symptoms (abnormal  bleeding, pain),  syncope, focal weakness, memory loss, numbness & tingling, skin/hair/nail changes, abnormal bruising or bleeding, anxiety, or depression.     Objective:   Physical Exam  General Appearance:    Alert, cooperative, no distress, appears stated age  Head:    Normocephalic, without obvious abnormality, atraumatic  Eyes:    PERRL, conjunctiva/corneas clear, EOM's intact, fundi    benign, both eyes  Ears:    Normal TM's and external ear canals, both ears  Nose:   Nares normal, septum midline, mucosa normal, no drainage    or sinus tenderness  Throat:   Lips, mucosa, and tongue normal; teeth and gums normal  Neck:   Supple, symmetrical, trachea midline, no adenopathy;    Thyroid: no enlargement/tenderness/nodules  Back:     Symmetric, no curvature, ROM normal, no CVA tenderness  Lungs:     Clear to auscultation bilaterally, respirations unlabored  Chest Wall:    No tenderness or deformity   Heart:    Regular rate and rhythm, S1 and S2 normal, no murmur, rub   or gallop  Breast Exam:    No tenderness, masses, or nipple abnormality  Abdomen:     Soft, non-tender, bowel sounds active all four quadrants,    no masses, no organomegaly  Genitalia:    External genitalia normal, cervix normal in appearance, no CMT, uterus in normal size and position, adnexa w/out mass or tenderness, mucosa pink and moist, no lesions  or discharge present  Rectal:    Normal external appearance  Extremities:   Extremities normal, atraumatic, no cyanosis or edema  Pulses:   2+ and symmetric all extremities  Skin:   Skin color, texture, turgor normal, no rashes or lesions  Lymph nodes:   Cervical, supraclavicular, and axillary nodes normal  Neurologic:   CNII-XII intact, normal strength, sensation and reflexes    throughout          Assessment & Plan:

## 2013-04-17 NOTE — Assessment & Plan Note (Signed)
Pap collected. 

## 2013-04-17 NOTE — Patient Instructions (Signed)
Follow up in 1 year or as needed Keep up the good work!  You look great! We'll notify you of your pap and lab results Call with any questions or concerns GOOD LUCK w/ School!!!

## 2013-04-17 NOTE — Assessment & Plan Note (Signed)
Pt's PE WNL.  Check labs.  Anticipatory guidance provided.  

## 2013-04-18 ENCOUNTER — Other Ambulatory Visit: Payer: Self-pay | Admitting: *Deleted

## 2013-04-18 NOTE — Telephone Encounter (Signed)
Error prescription already has a pending order by Dr. Beverely Low .   Ag cma

## 2013-04-19 ENCOUNTER — Telehealth: Payer: Self-pay | Admitting: *Deleted

## 2013-04-19 MED ORDER — ROPINIROLE HCL 0.5 MG PO TABS: ORAL_TABLET | ORAL | Status: DC

## 2013-04-19 NOTE — Telephone Encounter (Signed)
Ok to refill 

## 2013-04-19 NOTE — Telephone Encounter (Signed)
Dr. Beverely Low   I have received a fax from [facility] requesting a refill for ropinirole 0.5 mg.  I attempted to refill it and it has a pending order under your name.  Can I refill this medication or do I need to wait for further instructions.   Please Advise   Ag cma

## 2013-04-19 NOTE — Telephone Encounter (Signed)
Med filled.  

## 2013-04-19 NOTE — Telephone Encounter (Signed)
Rx has been refilled. Ag cma

## 2013-04-21 LAB — VITAMIN D 1,25 DIHYDROXY: Vitamin D 1, 25 (OH)2 Total: 76 pg/mL — ABNORMAL HIGH (ref 18–72)

## 2013-05-04 ENCOUNTER — Other Ambulatory Visit: Payer: Self-pay | Admitting: Family Medicine

## 2013-05-05 NOTE — Telephone Encounter (Signed)
Last visit-04/17/13   Last filled-10/18/12 #30, 1 Refill  Contract on file, no UDS  Ok to fill? Please advise. SW, CMA

## 2013-05-07 NOTE — Telephone Encounter (Signed)
Ok for #30, 1 refill.  Needs UDS if none on file

## 2013-05-08 ENCOUNTER — Encounter: Payer: Self-pay | Admitting: Family Medicine

## 2013-05-08 ENCOUNTER — Ambulatory Visit (INDEPENDENT_AMBULATORY_CARE_PROVIDER_SITE_OTHER): Payer: Managed Care, Other (non HMO) | Admitting: Family Medicine

## 2013-05-08 VITALS — BP 116/78 | HR 86 | Temp 98.1°F | Ht 62.0 in | Wt 152.0 lb

## 2013-05-08 DIAGNOSIS — G2581 Restless legs syndrome: Secondary | ICD-10-CM

## 2013-05-08 MED ORDER — PRAMIPEXOLE DIHYDROCHLORIDE 0.25 MG PO TABS: ORAL_TABLET | ORAL | Status: DC

## 2013-05-08 NOTE — Progress Notes (Signed)
  Subjective:    Patient ID: Stacie Rosario, female    DOB: December 13, 1990, 22 y.o.   MRN: [id number]  HPI RLS- deteriorated.  Previously only occurred at night but is now constant x2 weeks.  Still taking Requip nightly.  Pt reports 'pain but not pain'  And 'they just need to move'.  No change in caffeine intake, stress.   Review of Systems For ROS see HPI     Objective:   Physical Exam  Vitals reviewed. Constitutional: She is oriented to person, place, and time. She appears well-developed and well-nourished. No distress.  Neurological: She is alert and oriented to person, place, and time. No cranial nerve deficit. Coordination normal.  Psychomotor agitation- legs in constant movement  Skin: Skin is warm and dry.  Psychiatric: She has a normal mood and affect. Her behavior is normal.          Assessment & Plan:

## 2013-05-08 NOTE — Patient Instructions (Addendum)
Call in the next 2-4 weeks and let me know how you're doing w/ the Mirapex STOP the Requip START the Mirapex Call with any questions or concerns Hang in there!

## 2013-05-08 NOTE — Telephone Encounter (Signed)
Pt notified Rx available at the front desk.

## 2013-05-09 NOTE — Assessment & Plan Note (Signed)
Deteriorated.  Requip is now ineffective.  Switch to mirapex to determine whether this effectively controls sxs or whether she has developed a tolerance to both non-ergot dopamine agonists.  If no improvement, will add Lyrica.  Pt expressed understanding and is in agreement w/ plan.

## 2013-06-27 ENCOUNTER — Encounter: Payer: Self-pay | Admitting: Family Medicine

## 2013-07-06 ENCOUNTER — Other Ambulatory Visit: Payer: Self-pay

## 2013-07-14 IMAGING — US US ABDOMEN COMPLETE
1 series · 14 of 25 positions shown · non-contrast
Comparison: 10/10/2007 CT.

CLINICAL DATA: Chronic nausea and vomiting.  Mild right upper
quadrant tenderness.

COMPLETE ABDOMINAL ULTRASOUND

[Series 1: us abdomen complete · 0.30mm/px · 14 of 63 slices shown]
[im 1/63]
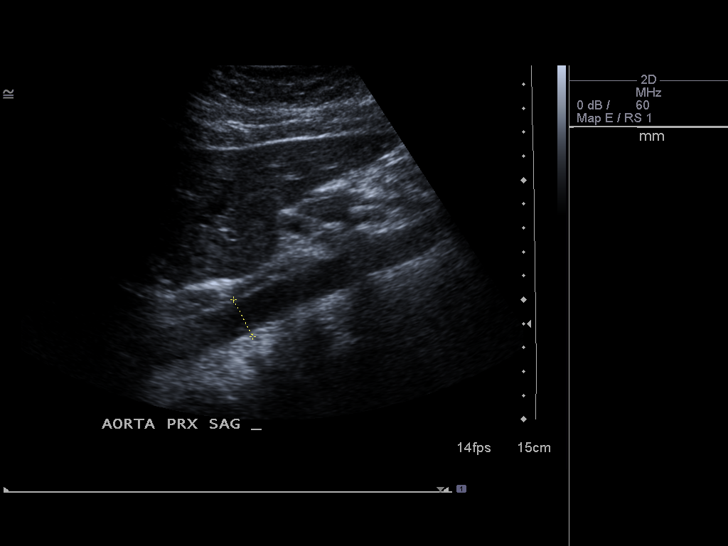
[im 6/63]
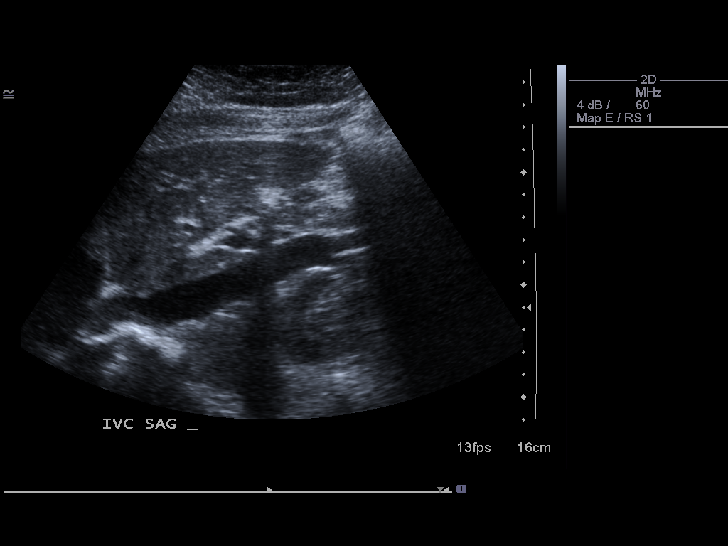
[im 11/63]
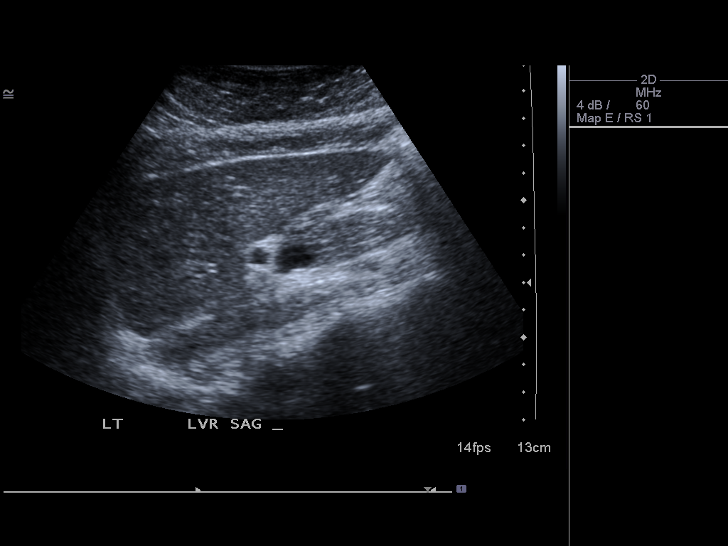
[im 16/63]
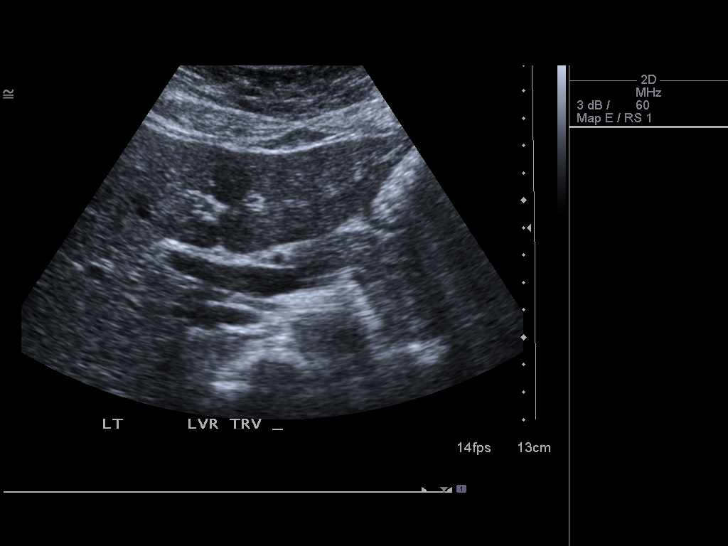
[im 21/63]
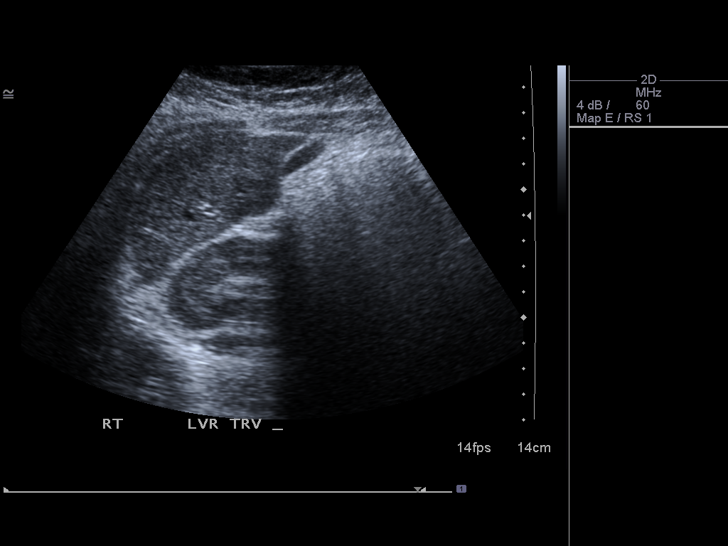
[im 24/63]
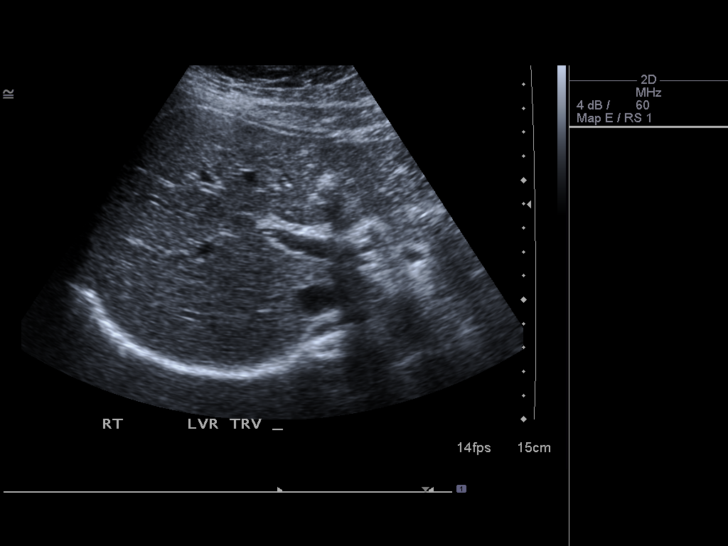
[im 29/63]
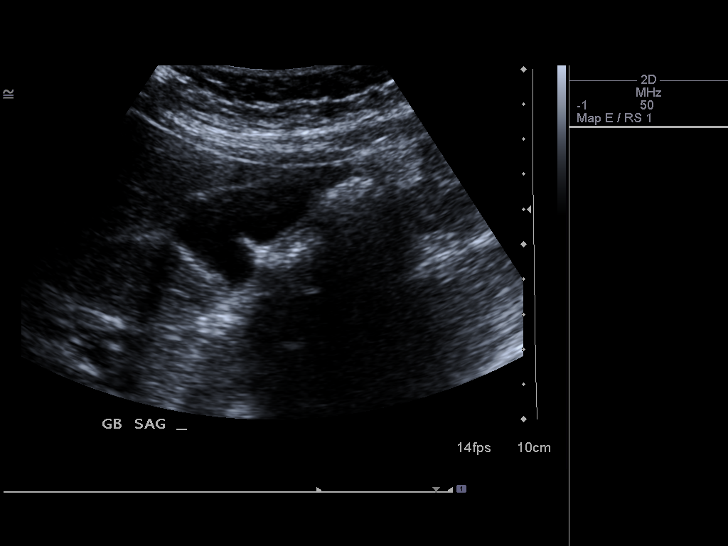
[im 34/63]
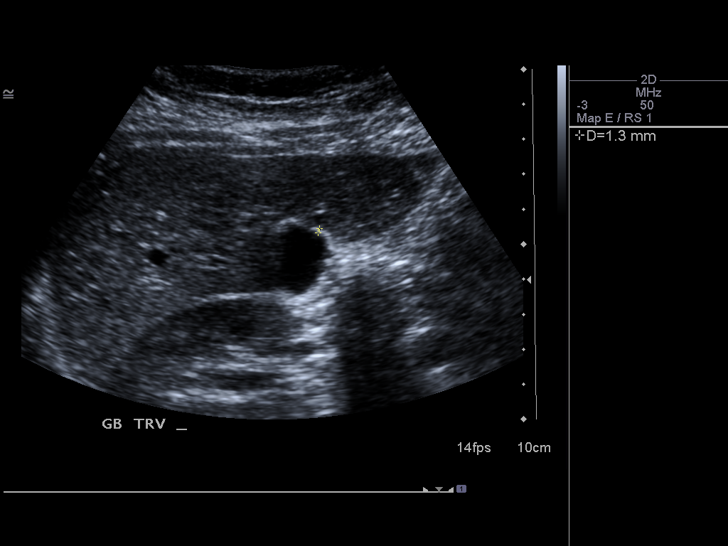
[im 39/63]
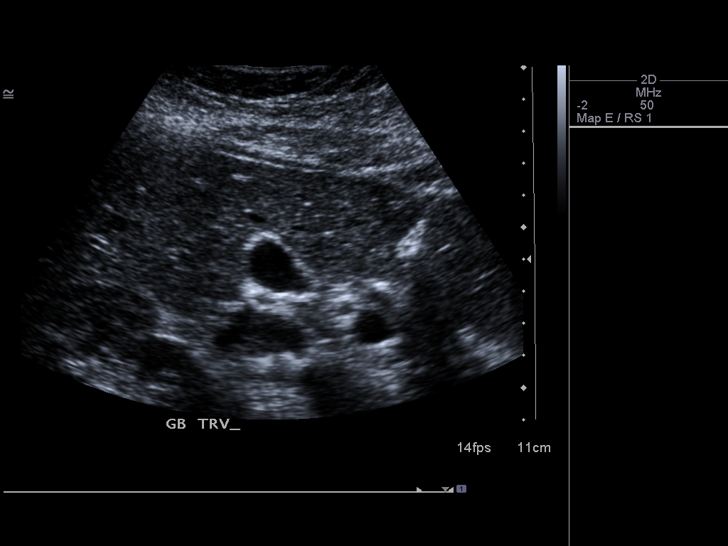
[im 42/63]
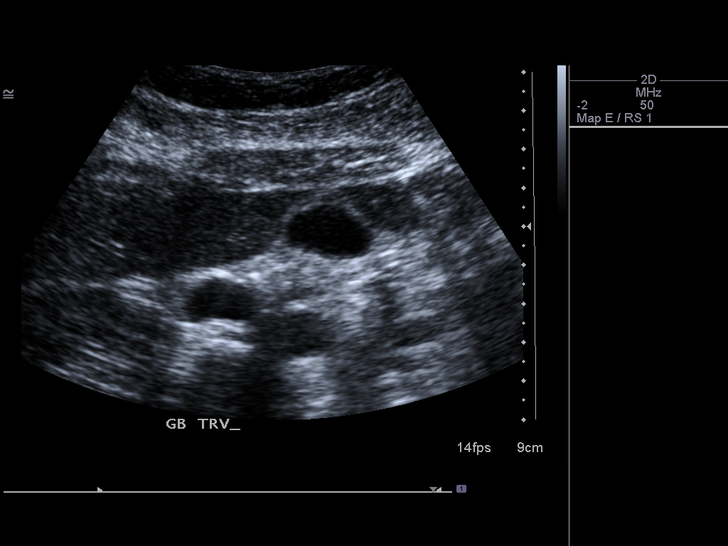
[im 47/63]
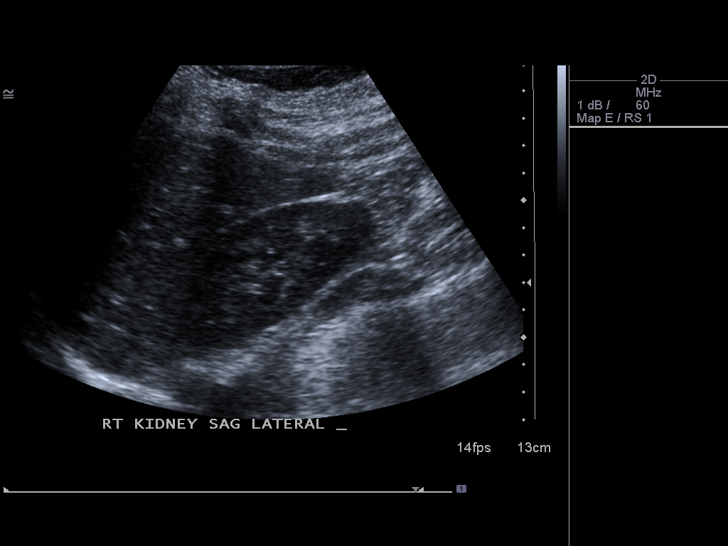
[im 52/63]
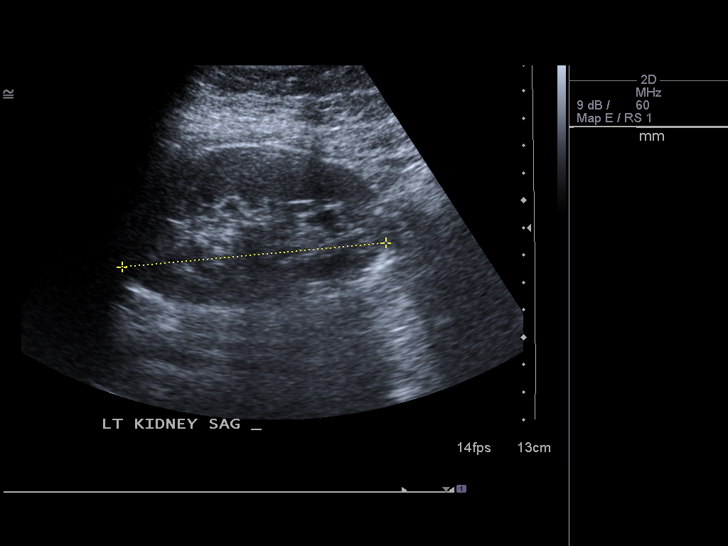
[im 57/63]
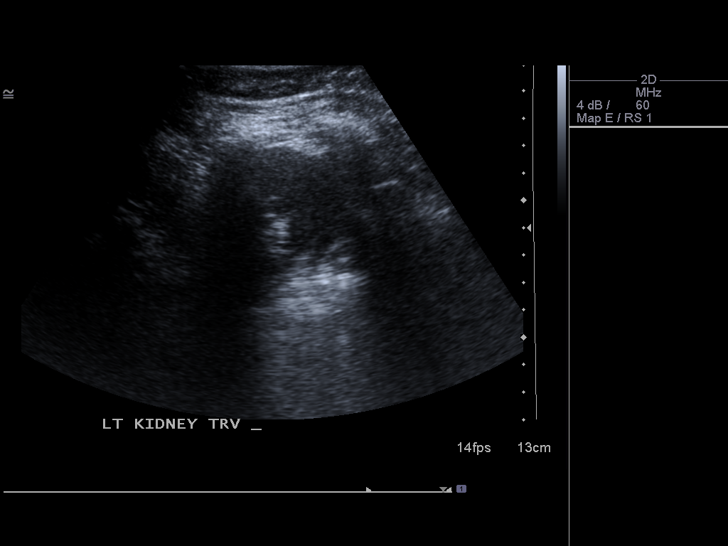
[im 63/63]
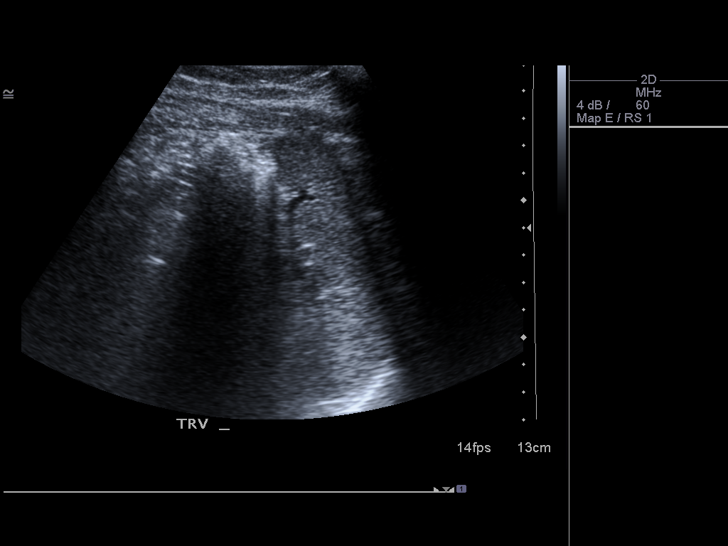

[14 of 25 positions shown; findings below may reference images not displayed]

FINDINGS: Gallbladder:  No gallstones, gallbladder wall thickening, or
pericholecystic fluid.

Common bile duct:  2 mm

Liver:  No focal lesion identified.  Within normal limits in
parenchymal echogenicity.

IVC:  Limited evaluation secondary to overlying bowel gas.

Pancreas:  No focal abnormality seen.

Spleen:  5 cm.  No focal mass.

Right Kidney:  10.6 cm. No hydronephrosis or renal mass.

Left Kidney:  9.7 cm. No hydronephrosis or renal mass.

Abdominal aorta:  Evaluation slightly limited by bowel gas.  No
aneurysm noted.
IMPRESSION: Slightly limited evaluation of portions of the inferior vena cava
and aorta secondary to bowel gas otherwise negative exam.

## 2013-09-01 ENCOUNTER — Other Ambulatory Visit: Payer: Self-pay | Admitting: Family Medicine

## 2013-09-01 NOTE — Telephone Encounter (Signed)
Med filled.  

## 2013-11-01 ENCOUNTER — Telehealth: Payer: Self-pay | Admitting: Family Medicine

## 2013-11-01 MED ORDER — PRAMIPEXOLE DIHYDROCHLORIDE 0.25 MG PO TABS: ORAL_TABLET | ORAL | Status: DC

## 2013-11-01 NOTE — Telephone Encounter (Signed)
Med filled.  

## 2013-11-01 NOTE — Telephone Encounter (Signed)
Patient called and requested a refill for pramipexole (MIRAPEX) 0.25 MG tablet   [facility]

## 2013-11-02 ENCOUNTER — Other Ambulatory Visit: Payer: Self-pay | Admitting: Family Medicine

## 2013-11-02 NOTE — Telephone Encounter (Signed)
This was done while pt was on phone.

## 2013-11-02 NOTE — Telephone Encounter (Signed)
Med filled.  

## 2013-11-02 NOTE — Telephone Encounter (Signed)
Patient called and stated that her rx for pramipexole (MIRAPEX) 0.25 MG tablet was sent to the wrong pharmacy. It was suppose to be sent to [id number] - [place], [place] - [address]. Please resend.

## 2013-12-04 ENCOUNTER — Telehealth: Payer: Self-pay | Admitting: Family Medicine

## 2013-12-04 MED ORDER — PRAMIPEXOLE DIHYDROCHLORIDE 0.25 MG PO TABS: ORAL_TABLET | ORAL | Status: DC

## 2013-12-04 MED ORDER — ALPRAZOLAM 0.25 MG PO TABS: ORAL_TABLET | ORAL | Status: DC

## 2013-12-04 NOTE — Telephone Encounter (Signed)
Med filled.  

## 2013-12-04 NOTE — Telephone Encounter (Signed)
Pt is up there for school.   Last OV 05-08-13 Xanax filled 9-4 #30 with 0 mirapex filled 11-02-13 #30 with 0

## 2013-12-04 NOTE — Telephone Encounter (Signed)
Patient called and requested refills for ALPRAZolam (XANAX) 0.25 MG tablet and pramipexole (MIRAPEX) 0.25 MG tablet but would like them to be send to a different pharmacy.  Pharmacy [facility] [place]  [phone]

## 2013-12-04 NOTE — Telephone Encounter (Signed)
Xanax, #30, 1 refill Mirapex, #30, 3 refills

## 2013-12-04 NOTE — Telephone Encounter (Signed)
Called pt to find out why Rx needs to be sent out of state? Had to leave a Vm for pt to return call.

## 2014-02-19 ENCOUNTER — Telehealth: Payer: Self-pay | Admitting: *Deleted

## 2014-02-19 NOTE — Telephone Encounter (Signed)
Caller name:  Marylene Landngela Relation to pt:  self Call back number: [phone]  Pharmacy:  [facility], [address], [place], [place] [id number],  Phone [phone]  Reason for call:   Pt called requesting refill on  Norgestimate-Ethinyl Estradiol Triphasic (TRI-SPRINTEC) 0.18/0.215/0.25 MG-35 MCG tablet  Last filled 02/16/2013, #28, 11 refills Last OV 05/08/2013  Pt wants prescription sent to above [place] pharmacy.  Has appt scheduled to see Tabori 04/18/2014 for CPE and Pap.  Please advise.  bw

## 2014-02-20 MED ORDER — NORGESTIM-ETH ESTRAD TRIPHASIC 0.18/0.215/0.25 MG-35 MCG PO TABS: 1.0000 | ORAL_TABLET | Freq: Every day | ORAL | Status: DC

## 2014-02-20 NOTE — Telephone Encounter (Signed)
Med filled.  

## 2014-04-02 ENCOUNTER — Other Ambulatory Visit: Payer: Self-pay | Admitting: Family Medicine

## 2014-04-02 NOTE — Telephone Encounter (Signed)
Med filled.  

## 2014-04-18 ENCOUNTER — Encounter: Payer: Self-pay | Admitting: Family Medicine

## 2014-04-18 ENCOUNTER — Ambulatory Visit (INDEPENDENT_AMBULATORY_CARE_PROVIDER_SITE_OTHER): Payer: Managed Care, Other (non HMO) | Admitting: Family Medicine

## 2014-04-18 VITALS — BP 110/78 | HR 91 | Temp 97.8°F | Resp 16 | Ht 63.0 in | Wt 147.4 lb

## 2014-04-18 DIAGNOSIS — Z01419 Encounter for gynecological examination (general) (routine) without abnormal findings: Secondary | ICD-10-CM

## 2014-04-18 LAB — CBC WITH DIFFERENTIAL/PLATELET
Basophils Absolute: 0 10*3/uL (ref 0.0–0.1)
Basophils Relative: 0.3 % (ref 0.0–3.0)
EOS PCT: 1.2 % (ref 0.0–5.0)
Eosinophils Absolute: 0.1 10*3/uL (ref 0.0–0.7)
HCT: 39.8 % (ref 36.0–46.0)
Hemoglobin: 13.3 g/dL (ref 12.0–15.0)
Lymphocytes Relative: 28.2 % (ref 12.0–46.0)
Lymphs Abs: 1.3 10*3/uL (ref 0.7–4.0)
MCHC: 33.4 g/dL (ref 30.0–36.0)
MCV: 89.7 fl (ref 78.0–100.0)
MONO ABS: 0.5 10*3/uL (ref 0.1–1.0)
Monocytes Relative: 9.9 % (ref 3.0–12.0)
NEUTROS PCT: 60.4 % (ref 43.0–77.0)
Neutro Abs: 2.8 10*3/uL (ref 1.4–7.7)
PLATELETS: 311 10*3/uL (ref 150.0–400.0)
RBC: 4.44 Mil/uL (ref 3.87–5.11)
RDW: 13.9 % (ref 11.5–15.5)
WBC: 4.6 10*3/uL (ref 4.0–10.5)

## 2014-04-18 LAB — BASIC METABOLIC PANEL
BUN: 11 mg/dL (ref 6–23)
CHLORIDE: 99 meq/L (ref 96–112)
CO2: 28 meq/L (ref 19–32)
CREATININE: 0.9 mg/dL (ref 0.4–1.2)
Calcium: 9.3 mg/dL (ref 8.4–10.5)
GFR: 81.55 mL/min (ref >60.00)
GLUCOSE: 84 mg/dL (ref 70–99)
Potassium: 4.2 mEq/L (ref 3.5–5.1)
Sodium: 134 mEq/L — ABNORMAL LOW (ref 135–145)

## 2014-04-18 LAB — TSH: TSH: 0.62 u[IU]/mL (ref 0.35–4.50)

## 2014-04-18 LAB — LIPID PANEL
Cholesterol: 238 mg/dL — ABNORMAL HIGH (ref 0–200)
HDL: 69.7 mg/dL (ref >39.00)
LDL Cholesterol: 152 mg/dL — ABNORMAL HIGH (ref 0–99)
NonHDL: 168.3
Total CHOL/HDL Ratio: 3
Triglycerides: 82 mg/dL (ref 0.0–149.0)
VLDL: 16.4 mg/dL (ref 0.0–40.0)

## 2014-04-18 LAB — HEPATIC FUNCTION PANEL
ALBUMIN: 3.8 g/dL (ref 3.5–5.2)
ALT: 27 U/L (ref 0–35)
AST: 25 U/L (ref 0–37)
Alkaline Phosphatase: 67 U/L (ref 39–117)
Bilirubin, Direct: 0 mg/dL (ref 0.0–0.3)
TOTAL PROTEIN: 7.9 g/dL (ref 6.0–8.3)
Total Bilirubin: 0.4 mg/dL (ref 0.2–1.2)

## 2014-04-18 LAB — VITAMIN D 25 HYDROXY (VIT D DEFICIENCY, FRACTURES): VITD: 50.12 ng/mL (ref 30.00–100.00)

## 2014-04-18 MED ORDER — PROMETHAZINE HCL 25 MG PO TABS: 25.0000 mg | ORAL_TABLET | Freq: Four times a day (QID) | ORAL | Status: DC | PRN

## 2014-04-18 NOTE — Progress Notes (Signed)
Pre visit review using our clinic review tool, if applicable. No additional management support is needed unless otherwise documented below in the visit note. 

## 2014-04-18 NOTE — Assessment & Plan Note (Signed)
Pt's PE WNL.  UTD on pap.  Check labs.  Anticipatory guidance provided.  

## 2014-04-18 NOTE — Progress Notes (Signed)
   Subjective:    Patient ID: Stacie Rosario, female    DOB: 10/03/1990, 23 y.o.   MRN: [id number]  HPI CPE- UTD on pap.  Pt started menses yesterday.     Review of Systems Patient reports no vision/ hearing changes, adenopathy,fever, weight change,  persistant/recurrent hoarseness , swallowing issues, chest pain, palpitations, edema, persistant/recurrent cough, hemoptysis, dyspnea (rest/exertional/paroxysmal nocturnal), gastrointestinal bleeding (melena, rectal bleeding), abdominal pain, significant heartburn, bowel changes, GU symptoms (dysuria, hematuria, incontinence), Gyn symptoms (abnormal  bleeding, pain),  syncope, focal weakness, memory loss, numbness & tingling, skin/hair/nail changes, abnormal bruising or bleeding, anxiety, or depression.     Objective:   Physical Exam  General Appearance:    Alert, cooperative, no distress, appears stated age  Head:    Normocephalic, without obvious abnormality, atraumatic  Eyes:    PERRL, conjunctiva/corneas clear, EOM's intact, fundi    benign, both eyes  Ears:    Normal TM's and external ear canals, both ears  Nose:   Nares normal, septum midline, mucosa normal, no drainage    or sinus tenderness  Throat:   Lips, mucosa, and tongue normal; teeth and gums normal  Neck:   Supple, symmetrical, trachea midline, no adenopathy;    Thyroid: no enlargement/tenderness/nodules  Back:     Symmetric, no curvature, ROM normal, no CVA tenderness  Lungs:     Clear to auscultation bilaterally, respirations unlabored  Chest Wall:    No tenderness or deformity   Heart:    Regular rate and rhythm, S1 and S2 normal, no murmur, rub   or gallop  Breast Exam:    No tenderness, masses, or nipple abnormality  Abdomen:     Soft, non-tender, bowel sounds active all four quadrants,    no masses, no organomegaly  Genitalia:    Deferred due to menses  Rectal:    Extremities:   Extremities normal, atraumatic, no cyanosis or edema  Pulses:   2+ and symmetric all  extremities  Skin:   Skin color, texture, turgor normal, no rashes or lesions  Lymph nodes:   Cervical, supraclavicular, and axillary nodes normal  Neurologic:   CNII-XII intact, normal strength, sensation and reflexes    throughout          Assessment & Plan:

## 2014-04-18 NOTE — Patient Instructions (Signed)
Follow up in 1 year or as needed We'll notify you of your lab results and make any changes if needed Keep up the good work!  You look great!! Good luck w/ school!!

## 2014-05-10 ENCOUNTER — Other Ambulatory Visit: Payer: Self-pay | Admitting: Family Medicine

## 2014-05-10 NOTE — Telephone Encounter (Signed)
Med filled.  

## 2014-05-28 ENCOUNTER — Telehealth: Payer: Self-pay | Admitting: General Practice

## 2014-05-28 MED ORDER — ALPRAZOLAM 0.25 MG PO TABS: ORAL_TABLET | ORAL | Status: DC

## 2014-05-28 NOTE — Telephone Encounter (Signed)
Med filled.  

## 2014-05-28 NOTE — Telephone Encounter (Signed)
Last OV 04-18-14 Alprazolam last filled 12-04-13 #30 with 1

## 2014-05-28 NOTE — Telephone Encounter (Signed)
Ok for #30 w/ 1

## 2014-07-30 ENCOUNTER — Telehealth: Payer: Self-pay | Admitting: Family Medicine

## 2014-07-30 MED ORDER — PRAMIPEXOLE DIHYDROCHLORIDE 0.25 MG PO TABS: 0.2500 mg | ORAL_TABLET | Freq: Every day | ORAL | Status: DC

## 2014-07-30 NOTE — Telephone Encounter (Signed)
Ok for #30, 3 refills 

## 2014-07-30 NOTE — Telephone Encounter (Signed)
Caller name:Desmarais, Marylene LandAngela Relation to pt: self  Call back number:[phone] Pharmacy: [facility] [address], [place], [place] [phone]    Reason for call: pt requesting a refill pramipexole (MIRAPEX) 0.25 MG tablet please send to new pharmacy.

## 2014-07-30 NOTE — Telephone Encounter (Signed)
Med filled.  

## 2014-07-30 NOTE — Telephone Encounter (Signed)
Please advise last ov 04-18-14. Ok to fill out of state?

## 2014-10-18 ENCOUNTER — Other Ambulatory Visit: Payer: Self-pay | Admitting: Family Medicine

## 2014-10-18 NOTE — Telephone Encounter (Signed)
Med filled.  

## 2014-11-19 ENCOUNTER — Other Ambulatory Visit: Payer: Self-pay | Admitting: Family Medicine

## 2014-11-19 NOTE — Telephone Encounter (Signed)
Med filled.  

## 2015-03-09 ENCOUNTER — Other Ambulatory Visit: Payer: Self-pay | Admitting: Family Medicine

## 2015-03-11 ENCOUNTER — Other Ambulatory Visit: Payer: Self-pay | Admitting: Family Medicine

## 2015-03-11 NOTE — Telephone Encounter (Signed)
Med filled.  

## 2015-05-31 ENCOUNTER — Telehealth: Payer: Self-pay | Admitting: Family Medicine

## 2015-05-31 NOTE — Telephone Encounter (Signed)
pre visit letter mailed 05/31/15 °

## 2015-06-21 ENCOUNTER — Ambulatory Visit (INDEPENDENT_AMBULATORY_CARE_PROVIDER_SITE_OTHER): Payer: Managed Care, Other (non HMO) | Admitting: Family Medicine

## 2015-06-21 ENCOUNTER — Encounter: Payer: Self-pay | Admitting: Family Medicine

## 2015-06-21 VITALS — BP 112/80 | HR 70 | Temp 98.0°F | Resp 16 | Ht 63.0 in | Wt 166.0 lb

## 2015-06-21 DIAGNOSIS — Z23 Encounter for immunization: Secondary | ICD-10-CM | POA: Diagnosis not present

## 2015-06-21 DIAGNOSIS — Z0000 Encounter for general adult medical examination without abnormal findings: Secondary | ICD-10-CM

## 2015-06-21 LAB — CBC WITH DIFFERENTIAL/PLATELET
BASOS PCT: 0.3 % (ref 0.0–3.0)
Basophils Absolute: 0 10*3/uL (ref 0.0–0.1)
EOS ABS: 0 10*3/uL (ref 0.0–0.7)
EOS PCT: 0.7 % (ref 0.0–5.0)
HCT: 40.2 % (ref 36.0–46.0)
HEMOGLOBIN: 13.2 g/dL (ref 12.0–15.0)
Lymphocytes Relative: 30.1 % (ref 12.0–46.0)
Lymphs Abs: 2.1 10*3/uL (ref 0.7–4.0)
MCHC: 32.8 g/dL (ref 30.0–36.0)
MCV: 87.5 fl (ref 78.0–100.0)
MONOS PCT: 8.4 % (ref 3.0–12.0)
Monocytes Absolute: 0.6 10*3/uL (ref 0.1–1.0)
Neutro Abs: 4.2 10*3/uL (ref 1.4–7.7)
Neutrophils Relative %: 60.5 % (ref 43.0–77.0)
Platelets: 281 10*3/uL (ref 150.0–400.0)
RBC: 4.59 Mil/uL (ref 3.87–5.11)
RDW: 13.9 % (ref 11.5–15.5)
WBC: 7 10*3/uL (ref 4.0–10.5)

## 2015-06-21 LAB — HEPATIC FUNCTION PANEL
ALT: 13 U/L (ref 0–35)
AST: 16 U/L (ref 0–37)
Albumin: 4.2 g/dL (ref 3.5–5.2)
Alkaline Phosphatase: 61 U/L (ref 39–117)
BILIRUBIN TOTAL: 0.3 mg/dL (ref 0.2–1.2)
Bilirubin, Direct: 0.1 mg/dL (ref 0.0–0.3)
Total Protein: 7.7 g/dL (ref 6.0–8.3)

## 2015-06-21 LAB — BASIC METABOLIC PANEL
BUN: 10 mg/dL (ref 6–23)
CALCIUM: 10 mg/dL (ref 8.4–10.5)
CO2: 29 mEq/L (ref 19–32)
Chloride: 101 mEq/L (ref 96–112)
Creatinine, Ser: 0.86 mg/dL (ref 0.40–1.20)
GFR: 85.13 mL/min (ref >60.00)
Glucose, Bld: 82 mg/dL (ref 70–99)
Potassium: 4.4 mEq/L (ref 3.5–5.1)
SODIUM: 139 meq/L (ref 135–145)

## 2015-06-21 LAB — LIPID PANEL
CHOL/HDL RATIO: 3
Cholesterol: 204 mg/dL — ABNORMAL HIGH (ref 0–200)
HDL: 62.7 mg/dL (ref >39.00)
LDL Cholesterol: 116 mg/dL — ABNORMAL HIGH (ref 0–99)
NONHDL: 141.68
Triglycerides: 128 mg/dL (ref 0.0–149.0)
VLDL: 25.6 mg/dL (ref 0.0–40.0)

## 2015-06-21 LAB — TSH: TSH: 0.73 u[IU]/mL (ref 0.35–4.50)

## 2015-06-21 LAB — VITAMIN D 25 HYDROXY (VIT D DEFICIENCY, FRACTURES): VITD: 34.35 ng/mL (ref 30.00–100.00)

## 2015-06-21 NOTE — Progress Notes (Signed)
   Subjective:    Patient ID: Stacie Rosario, female    DOB: 12/31/1990, 24 y.o.   MRN: [id number]  HPI CPE- no current concerns.  Due for pap next year.   Review of Systems Patient reports no vision/ hearing changes, adenopathy,fever, weight change,  persistant/recurrent hoarseness , swallowing issues, chest pain, palpitations, edema, persistant/recurrent cough, hemoptysis, dyspnea (rest/exertional/paroxysmal nocturnal), gastrointestinal bleeding (melena, rectal bleeding), abdominal pain, significant heartburn, bowel changes, GU symptoms (dysuria, hematuria, incontinence), Gyn symptoms (abnormal  bleeding, pain),  syncope, focal weakness, memory loss, numbness & tingling, skin/hair/nail changes, abnormal bruising or bleeding, anxiety, or depression.     Objective:   Physical Exam  General Appearance:    Alert, cooperative, no distress, appears stated age  Head:    Normocephalic, without obvious abnormality, atraumatic  Eyes:    PERRL, conjunctiva/corneas clear, EOM's intact, fundi    benign, both eyes  Ears:    Normal TM's and external ear canals, both ears  Nose:   Nares normal, septum midline, mucosa normal, no drainage    or sinus tenderness  Throat:   Lips, mucosa, and tongue normal; teeth and gums normal  Neck:   Supple, symmetrical, trachea midline, no adenopathy;    Thyroid: no enlargement/tenderness/nodules  Back:     Symmetric, no curvature, ROM normal, no CVA tenderness  Lungs:     Clear to auscultation bilaterally, respirations unlabored  Chest Wall:    No tenderness or deformity   Heart:    Regular rate and rhythm, S1 and S2 normal, no murmur, rub   or gallop  Breast Exam:    No tenderness, masses, or nipple abnormality  Abdomen:     Soft, non-tender, bowel sounds active all four quadrants,    no masses, no organomegaly  Genitalia:    deferred  Rectal:    Extremities:   Extremities normal, atraumatic, no cyanosis or edema  Pulses:   2+ and symmetric all extremities    Skin:   Skin color, texture, turgor normal, no rashes or lesions  Lymph nodes:   Cervical, supraclavicular, and axillary nodes normal  Neurologic:   CNII-XII intact, normal strength, sensation and reflexes    throughout          Assessment & Plan:

## 2015-06-21 NOTE — Patient Instructions (Signed)
Follow up in 1 year or as needed We'll notify you of your lab results and make any changes if needed Keep up the good work on healthy diet and regular exercise- you look great! Call with any questions or concerns If you want to join us at the new [place] office, any scheduled appointments will automatically transfer and we will see you at [address], [place], [place] [id number]  Happy Fall!! Welcome home!!!

## 2015-06-21 NOTE — Progress Notes (Signed)
Pre visit review using our clinic review tool, if applicable. No additional management support is needed unless otherwise documented below in the visit note. 

## 2015-06-21 NOTE — Assessment & Plan Note (Signed)
Pt's PE WNL.  UTD on pap- due next year.  Check labs.  Flu shot given.  Anticipatory guidance provided.

## 2015-07-07 ENCOUNTER — Other Ambulatory Visit: Payer: Self-pay | Admitting: Family Medicine

## 2015-07-08 NOTE — Telephone Encounter (Signed)
Medication filled to pharmacy as requested.   

## 2015-08-01 ENCOUNTER — Ambulatory Visit (INDEPENDENT_AMBULATORY_CARE_PROVIDER_SITE_OTHER): Payer: Managed Care, Other (non HMO) | Admitting: Family Medicine

## 2015-08-01 ENCOUNTER — Encounter: Payer: Self-pay | Admitting: Family Medicine

## 2015-08-01 VITALS — BP 110/78 | HR 88 | Temp 98.3°F | Resp 16 | Ht 63.0 in | Wt 166.4 lb

## 2015-08-01 DIAGNOSIS — K115 Sialolithiasis: Secondary | ICD-10-CM | POA: Diagnosis not present

## 2015-08-01 NOTE — Progress Notes (Signed)
Pre visit review using our clinic review tool, if applicable. No additional management support is needed unless otherwise documented below in the visit note. 

## 2015-08-01 NOTE — Patient Instructions (Signed)
This is consistent w/ a submandibular salivary gland stone and should improve w/ time Drink plenty of fluids HEAT on the gland Suck on sour candies to help open the duct Call with any questions or concerns Hang in there! Happy Holidays!!!   Salivary Stone A salivary stone is a mineral deposit that builds up in the ducts that drain your salivary glands. Most salivary gland stones are made of calcium. When a stone forms, saliva can back up into the gland and cause painful swelling. Your salivary glands are the glands that produce spit (saliva). You have six major salivary glands. Each gland has a duct that carries saliva into your mouth. Saliva keeps your mouth moist and breaks down the food that you eat. It also helps to prevent tooth decay. Two salivary glands are located just in front of your ears (parotid). The ducts for these glands open up inside your cheeks, near your back teeth. You also have two glands under your tongue (sublingual) and two glands under your jaw (submandibular). The ducts for these glands open under your tongue. A stone can form in any salivary gland. The most common place for a salivary stone to develop is in a submandibular salivary gland. CAUSES Any condition that reduces the flow of saliva may lead to stone formation. It is not known why some people form stones and others do not.  RISK FACTORS You may be more likely to develop a salivary stone if you:  Are female.  Do not drink enough water.  Smoke.  Have high blood pressure.  Have gout.  Have diabetes. SIGNS AND SYMPTOMS The main sign of a salivary gland stone is sudden swelling of a salivary gland when eating. This usually happens under the jaw on one side. Other signs and symptoms include:  Swelling of the cheek or under the tongue when eating.  Pain in the swollen area.  Trouble chewing or swallowing.  Swelling that goes down after eating. DIAGNOSIS Your health care provider may diagnose a  salivary gland stone based on your signs and symptoms. The health care provider will also do a physical exam. In many cases, a stone can be felt in a duct inside your mouth. You may need to see an ear, nose, and throat specialist (ENT or otolaryngologist) for diagnosis and treatment. You may also need to have diagnostic tests. These may include imaging studies to check for a stone, such as:  X-rays.  Ultrasound.  CT scan.  MRI. TREATMENT Home care may be enough to treat a small stone that is not causing symptoms. Treatment of a stone that is large enough to cause symptoms may include:  Probing and widening the duct to allow the stone to pass.  Inserting a thin, flexible scope (endoscope) into the duct to locate and remove the stone.  Breaking up the stone with sound waves.  Removing the entire salivary gland. HOME CARE INSTRUCTIONS  Drink enough fluid to keep your urine clear or pale yellow.  Follow these instructions every few hours:  Suck on a lemon candy to stimulate the flow of saliva.  Put a hot compress over the gland.  Gently massage the gland.  Do not use any tobacco products, including cigarettes, chewing tobacco, or electronic cigarettes. If you need help quitting, ask your health care provider. SEEK MEDICAL CARE IF:  You have pain and swelling in your face, jaw, or mouth after eating.  You have persistent swelling in any of these places:  In front of your  ear.  Under your jaw.  Inside your mouth. SEEK IMMEDIATE MEDICAL CARE IF:  You have pain and swelling in your face, jaw, or mouth that are getting worse.  Your pain and swelling make it hard to swallow or breathe.   This information is not intended to replace advice given to you by your health care provider. Make sure you discuss any questions you have with your health care provider.   Document Released: 09/24/2004 Document Revised: 09/07/2014 Document Reviewed: 01/17/2014 Elsevier Interactive Patient  Education [employer]2016 [employer] Inc.

## 2015-08-01 NOTE — Progress Notes (Signed)
   Subjective:    Patient ID: Stacie Rosario, female    DOB: 08/01/1991, 24 y.o.   MRN: [id number]  HPI Swollen lymph node- yesterday felt swollen area on R lateral neck and it was very TTP.  Pain was worsening w/ eating.  Resolved 10 minutes after she stopped eating.  sxs returned during dinner last night.  Very painful and swollen this morning.  Swelling has resolved but still tender.   Review of Systems For ROS see HPI     Objective:   Physical Exam  Constitutional: She is oriented to person, place, and time. She appears well-developed and well-nourished. No distress.  HENT:  Head: Normocephalic and atraumatic.  Mouth/Throat: Oropharynx is clear and moist. No oropharyngeal exudate.  Eyes: Conjunctivae and EOM are normal. Pupils are equal, round, and reactive to light.  Neck: Normal range of motion. Neck supple.  Mild TTP over R submandibular salivary gland w/o palpable swelling or enlargement  Lymphadenopathy:    She has no cervical adenopathy.  Neurological: She is alert and oriented to person, place, and time.  Skin: Skin is warm and dry.  Psychiatric: She has a normal mood and affect. Her behavior is normal. Thought content normal.  Vitals reviewed.         Assessment & Plan:

## 2015-08-01 NOTE — Assessment & Plan Note (Signed)
New.  Reviewed dx and causes w/ pt.  No evidence of infxn- no need for abx.  Reviewed supportive care and red flags that should prompt return. Pt expressed understanding and is in agreement w/ plan.

## 2015-08-02 ENCOUNTER — Other Ambulatory Visit: Payer: Self-pay | Admitting: Family Medicine

## 2015-08-02 NOTE — Telephone Encounter (Signed)
Medication filled to pharmacy as requested.   

## 2015-08-19 ENCOUNTER — Telehealth: Payer: Self-pay | Admitting: Family Medicine

## 2015-08-19 DIAGNOSIS — L299 Pruritus, unspecified: Secondary | ICD-10-CM

## 2015-08-19 NOTE — Telephone Encounter (Signed)
Spoke with pt, she advised that she was having itching of the breasts and had spoke ti PCP at CPE visit in October. Referral placed since pt was seen [facility] this [facility] and they advised it would be good to find out what is causing the itching.

## 2015-08-19 NOTE — Telephone Encounter (Signed)
Relation to HY:QMVHpt:self Call back [phone]   Reason for call:  Patient requesting a dermatology referral

## 2015-08-22 ENCOUNTER — Encounter: Payer: Self-pay | Admitting: Family Medicine

## 2015-09-06 ENCOUNTER — Other Ambulatory Visit: Payer: Self-pay | Admitting: Family Medicine

## 2015-09-10 ENCOUNTER — Other Ambulatory Visit: Payer: Self-pay | Admitting: Family Medicine

## 2015-10-01 ENCOUNTER — Ambulatory Visit: Payer: Managed Care, Other (non HMO) | Admitting: Family Medicine

## 2015-10-03 ENCOUNTER — Encounter: Payer: Self-pay | Admitting: Family Medicine

## 2015-10-03 ENCOUNTER — Ambulatory Visit (INDEPENDENT_AMBULATORY_CARE_PROVIDER_SITE_OTHER): Payer: Managed Care, Other (non HMO) | Admitting: Family Medicine

## 2015-10-03 VITALS — BP 120/82 | HR 92 | Temp 97.8°F | Ht 63.0 in | Wt 173.8 lb

## 2015-10-03 DIAGNOSIS — R5383 Other fatigue: Secondary | ICD-10-CM | POA: Diagnosis not present

## 2015-10-03 DIAGNOSIS — R635 Abnormal weight gain: Secondary | ICD-10-CM | POA: Diagnosis not present

## 2015-10-03 LAB — CBC WITH DIFFERENTIAL/PLATELET
BASOS ABS: 0 10*3/uL (ref 0.0–0.1)
Basophils Relative: 0.3 % (ref 0.0–3.0)
EOS PCT: 2 % (ref 0.0–5.0)
Eosinophils Absolute: 0.1 10*3/uL (ref 0.0–0.7)
HEMATOCRIT: 40.9 % (ref 36.0–46.0)
Hemoglobin: 13.3 g/dL (ref 12.0–15.0)
LYMPHS PCT: 29.5 % (ref 12.0–46.0)
Lymphs Abs: 1.9 10*3/uL (ref 0.7–4.0)
MCHC: 32.5 g/dL (ref 30.0–36.0)
MCV: 90.1 fl (ref 78.0–100.0)
MONOS PCT: 7.1 % (ref 3.0–12.0)
Monocytes Absolute: 0.5 10*3/uL (ref 0.1–1.0)
Neutro Abs: 4 10*3/uL (ref 1.4–7.7)
Neutrophils Relative %: 61.1 % (ref 43.0–77.0)
Platelets: 293 10*3/uL (ref 150.0–400.0)
RBC: 4.54 Mil/uL (ref 3.87–5.11)
RDW: 15.1 % (ref 11.5–15.5)
WBC: 6.6 10*3/uL (ref 4.0–10.5)

## 2015-10-03 LAB — BASIC METABOLIC PANEL
BUN: 10 mg/dL (ref 6–23)
CHLORIDE: 104 meq/L (ref 96–112)
CO2: 26 meq/L (ref 19–32)
CREATININE: 0.89 mg/dL (ref 0.40–1.20)
Calcium: 9.5 mg/dL (ref 8.4–10.5)
GFR: 81.64 mL/min (ref >60.00)
GLUCOSE: 89 mg/dL (ref 70–99)
Potassium: 4.2 mEq/L (ref 3.5–5.1)
SODIUM: 139 meq/L (ref 135–145)

## 2015-10-03 LAB — T4, FREE: FREE T4: 1.18 ng/dL (ref 0.60–1.60)

## 2015-10-03 LAB — TSH: TSH: 1.36 u[IU]/mL (ref 0.35–4.50)

## 2015-10-03 LAB — T3, FREE: T3 FREE: 3.9 pg/mL (ref 2.3–4.2)

## 2015-10-03 NOTE — Assessment & Plan Note (Signed)
New.  Pt reports she continues to gain weight despite attention to diet and regular exercise.  Check labs to r/o thyroid disease as this runs in the family.  Discussed need for increased cardio in addition to her weight training.  Will follow.

## 2015-10-03 NOTE — Assessment & Plan Note (Signed)
New.  Unclear if this is stress/seasonal related or if pt has underlying metabolic issues such as thyroid or anemia.  Check labs.  Encouraged rest, daily vitamin, adequate hydration, and regular exercise.  Will follow.

## 2015-10-03 NOTE — Progress Notes (Signed)
Pre visit review using our clinic review tool, if applicable. No additional management support is needed unless otherwise documented below in the visit note. 

## 2015-10-03 NOTE — Progress Notes (Signed)
   Subjective:    Patient ID: Catalina Antigua, female    DOB: 06-17-1991, 25 y.o.   MRN: [id number]  HPI Weight gain- pt reports increased fatigue and weight gain over the past few months.  Pt reports she has been really 'diligent' over the past year on healthy diet and regular exercise.  + family hx of thyroid.  Exercising 5-6 days/week.  Pt does cardio 3x/week and weight lifting the rest.  Pt attempting to eat a low carb, higher protein diet.  Pt reports stress level is at baseline.  Pt reports sleeping well.  No changes to hair, some increased dry skin.   Review of Systems For ROS see HPI     Objective:   Physical Exam  Constitutional: She is oriented to person, place, and time. She appears well-developed and well-nourished. No distress.  HENT:  Head: Normocephalic and atraumatic.  Eyes: Conjunctivae and EOM are normal. Pupils are equal, round, and reactive to light.  Neck: Normal range of motion. Neck supple. No thyromegaly present.  Cardiovascular: Normal rate, regular rhythm, normal heart sounds and intact distal pulses.   No murmur heard. Pulmonary/Chest: Effort normal and breath sounds normal. No respiratory distress.  Abdominal: Soft. She exhibits no distension. There is no tenderness.  Musculoskeletal: She exhibits no edema.  Lymphadenopathy:    She has no cervical adenopathy.  Neurological: She is alert and oriented to person, place, and time.  Skin: Skin is warm and dry.  Psychiatric: She has a normal mood and affect. Her behavior is normal.  Vitals reviewed.         Assessment & Plan:

## 2015-10-03 NOTE — Patient Instructions (Signed)
Follow up as needed We'll notify you of your lab results and make any changes if needed Keep up the good work on healthy diet and regular exercise- you can do it! Call with any questions or concerns Hang in there!!!

## 2015-10-24 ENCOUNTER — Other Ambulatory Visit: Payer: Self-pay | Admitting: Family Medicine

## 2015-10-24 NOTE — Telephone Encounter (Signed)
Medication filled to pharmacy as requested.   

## 2016-02-21 ENCOUNTER — Telehealth: Payer: Self-pay | Admitting: Family Medicine

## 2016-02-21 MED ORDER — PRAMIPEXOLE DIHYDROCHLORIDE 0.25 MG PO TABS: 0.2500 mg | ORAL_TABLET | Freq: Every day | ORAL | Status: DC

## 2016-02-21 NOTE — Telephone Encounter (Signed)
Medication filled to pharmacy as requested.   

## 2016-02-21 NOTE — Telephone Encounter (Signed)
Pt states that she needs refill on pramipexole, [facility] in [place]

## 2016-06-10 ENCOUNTER — Other Ambulatory Visit: Payer: Self-pay | Admitting: Family Medicine

## 2016-06-10 NOTE — Telephone Encounter (Signed)
Last OV 10/03/15 [facility]ape[facility] last filled 02/21/16 #30 with 3

## 2016-06-10 NOTE — Telephone Encounter (Signed)
Medication filled to pharmacy as requested.   

## 2016-06-22 ENCOUNTER — Other Ambulatory Visit (HOSPITAL_COMMUNITY)
Admission: RE | Admit: 2016-06-22 | Discharge: 2016-06-22 | Disposition: A | Discharge status: Home / Self Care | Payer: BLUE CROSS/BLUE SHIELD | Source: Ambulatory Visit | Attending: Family Medicine | Admitting: Family Medicine

## 2016-06-22 ENCOUNTER — Encounter: Payer: Self-pay | Admitting: Family Medicine

## 2016-06-22 ENCOUNTER — Ambulatory Visit (INDEPENDENT_AMBULATORY_CARE_PROVIDER_SITE_OTHER): Payer: BLUE CROSS/BLUE SHIELD | Admitting: Family Medicine

## 2016-06-22 VITALS — BP 118/76 | HR 86 | Temp 98.3°F | Resp 16 | Ht 63.0 in | Wt 165.4 lb

## 2016-06-22 DIAGNOSIS — Z01419 Encounter for gynecological examination (general) (routine) without abnormal findings: Secondary | ICD-10-CM | POA: Insufficient documentation

## 2016-06-22 DIAGNOSIS — N6489 Other specified disorders of breast: Secondary | ICD-10-CM | POA: Diagnosis not present

## 2016-06-22 DIAGNOSIS — Z1151 Encounter for screening for human papillomavirus (HPV): Secondary | ICD-10-CM | POA: Insufficient documentation

## 2016-06-22 DIAGNOSIS — Z0000 Encounter for general adult medical examination without abnormal findings: Secondary | ICD-10-CM

## 2016-06-22 DIAGNOSIS — Z803 Family history of malignant neoplasm of breast: Secondary | ICD-10-CM | POA: Diagnosis not present

## 2016-06-22 DIAGNOSIS — Z124 Encounter for screening for malignant neoplasm of cervix: Secondary | ICD-10-CM

## 2016-06-22 LAB — CBC WITH DIFFERENTIAL/PLATELET
BASOS ABS: 0 10*3/uL (ref 0.0–0.1)
Basophils Relative: 0.1 % (ref 0.0–3.0)
Eosinophils Absolute: 0.1 10*3/uL (ref 0.0–0.7)
Eosinophils Relative: 1.7 % (ref 0.0–5.0)
HEMATOCRIT: 39.2 % (ref 36.0–46.0)
HEMOGLOBIN: 13.3 g/dL (ref 12.0–15.0)
LYMPHS PCT: 29.2 % (ref 12.0–46.0)
Lymphs Abs: 1.9 10*3/uL (ref 0.7–4.0)
MCHC: 34.1 g/dL (ref 30.0–36.0)
MCV: 87.9 fl (ref 78.0–100.0)
MONOS PCT: 5.2 % (ref 3.0–12.0)
Monocytes Absolute: 0.3 10*3/uL (ref 0.1–1.0)
NEUTROS ABS: 4.1 10*3/uL (ref 1.4–7.7)
Neutrophils Relative %: 63.8 % (ref 43.0–77.0)
PLATELETS: 271 10*3/uL (ref 150.0–400.0)
RBC: 4.46 Mil/uL (ref 3.87–5.11)
RDW: 13.7 % (ref 11.5–15.5)
WBC: 6.4 10*3/uL (ref 4.0–10.5)

## 2016-06-22 LAB — HEPATIC FUNCTION PANEL
ALT: 17 U/L (ref 0–35)
AST: 18 U/L (ref 0–37)
Albumin: 4.2 g/dL (ref 3.5–5.2)
Alkaline Phosphatase: 57 U/L (ref 39–117)
BILIRUBIN DIRECT: 0.1 mg/dL (ref 0.0–0.3)
TOTAL PROTEIN: 7 g/dL (ref 6.0–8.3)
Total Bilirubin: 0.4 mg/dL (ref 0.2–1.2)

## 2016-06-22 LAB — TSH: TSH: 0.86 u[IU]/mL (ref 0.35–4.50)

## 2016-06-22 LAB — BASIC METABOLIC PANEL
BUN: 9 mg/dL (ref 6–23)
CALCIUM: 9.6 mg/dL (ref 8.4–10.5)
CO2: 27 meq/L (ref 19–32)
CREATININE: 0.79 mg/dL (ref 0.40–1.20)
Chloride: 103 mEq/L (ref 96–112)
GFR: 93.16 mL/min (ref >60.00)
GLUCOSE: 90 mg/dL (ref 70–99)
Potassium: 4.6 mEq/L (ref 3.5–5.1)
SODIUM: 136 meq/L (ref 135–145)

## 2016-06-22 LAB — VITAMIN D 25 HYDROXY (VIT D DEFICIENCY, FRACTURES): VITD: 38.11 ng/mL (ref 30.00–100.00)

## 2016-06-22 LAB — LIPID PANEL
CHOL/HDL RATIO: 3
Cholesterol: 179 mg/dL (ref 0–200)
HDL: 57.9 mg/dL (ref >39.00)
LDL Cholesterol: 105 mg/dL — ABNORMAL HIGH (ref 0–99)
NONHDL: 121.32
Triglycerides: 82 mg/dL (ref 0.0–149.0)
VLDL: 16.4 mg/dL (ref 0.0–40.0)

## 2016-06-22 NOTE — Assessment & Plan Note (Signed)
Pap collected. 

## 2016-06-22 NOTE — Progress Notes (Signed)
   Subjective:    Patient ID: Stacie Rosario, female    DOB: 01/31/1991, 25 y.o.   MRN: [id number]  HPI CPE- due for pap today.  UTD on Tdap.  Pt is exercising regularly and down 10 lbs since last visit.  Pt reports R breast is larger than L.  It has always been this way but recently more noticeable.  + family hx.   Review of Systems Patient reports no vision/ hearing changes, adenopathy,fever, weight change,  persistant/recurrent hoarseness , swallowing issues, chest pain, palpitations, edema, persistant/recurrent cough, hemoptysis, dyspnea (rest/exertional/paroxysmal nocturnal), gastrointestinal bleeding (melena, rectal bleeding), abdominal pain, significant heartburn, bowel changes, GU symptoms (dysuria, hematuria, incontinence), Gyn symptoms (abnormal  bleeding, pain),  syncope, focal weakness, memory loss, numbness & tingling, skin/hair/nail changes, abnormal bruising or bleeding, anxiety, or depression.     Objective:   Physical Exam  General Appearance:    Alert, cooperative, no distress, appears stated age  Head:    Normocephalic, without obvious abnormality, atraumatic  Eyes:    PERRL, conjunctiva/corneas clear, EOM's intact, fundi    benign, both eyes  Ears:    Normal TM's and external ear canals, both ears  Nose:   Nares normal, septum midline, mucosa normal, no drainage    or sinus tenderness  Throat:   Lips, mucosa, and tongue normal; teeth and gums normal  Neck:   Supple, symmetrical, trachea midline, no adenopathy;    Thyroid: no enlargement/tenderness/nodules  Back:     Symmetric, no curvature, ROM normal, no CVA tenderness  Lungs:     Clear to auscultation bilaterally, respirations unlabored  Chest Wall:    No tenderness or deformity   Heart:    Regular rate and rhythm, S1 and S2 normal, no murmur, rub   or gallop  Breast Exam:    No tenderness, masses, or nipple abnormality  Abdomen:     Soft, non-tender, bowel sounds active all four quadrants,    no masses, no  organomegaly  Genitalia:    External genitalia normal, cervix normal in appearance, no CMT, uterus in normal size and position, adnexa w/out mass or tenderness, mucosa pink and moist, no lesions or discharge present  Rectal:    Normal external appearance  Extremities:   Extremities normal, atraumatic, no cyanosis or edema  Pulses:   2+ and symmetric all extremities  Skin:   Skin color, texture, turgor normal, no rashes or lesions  Lymph nodes:   Cervical, supraclavicular, and axillary nodes normal  Neurologic:   CNII-XII intact, normal strength, sensation and reflexes    throughout          Assessment & Plan:

## 2016-06-22 NOTE — Progress Notes (Signed)
Pre visit review using our clinic review tool, if applicable. No additional management support is needed unless otherwise documented below in the visit note. 

## 2016-06-22 NOTE — Assessment & Plan Note (Signed)
Pt's PE WNL.  She is concerned that R breast is bigger.  Due to family hx of breast cancer will get US to assess.  Check labs.  Anticipatory guidance provided.

## 2016-06-22 NOTE — Patient Instructions (Signed)
Follow up in 1 year or as needed We'll notify you of your lab results and make any changes if needed Keep up the good work on healthy diet and regular exercise- you look great! We'll call you with your ultrasound appt for the breast asymmetry Zyrtec daily along w/ Flonase- 2 sprays each nostril for allergies Call with any questions or concerns Happy Fall!!!

## 2016-06-24 LAB — CYTOLOGY - PAP
DIAGNOSIS: NEGATIVE
HPV (WINDOPATH): NOT DETECTED

## 2016-06-25 ENCOUNTER — Other Ambulatory Visit: Payer: Self-pay | Admitting: Family Medicine

## 2016-06-25 DIAGNOSIS — N6489 Other specified disorders of breast: Secondary | ICD-10-CM

## 2016-06-25 DIAGNOSIS — Z803 Family history of malignant neoplasm of breast: Secondary | ICD-10-CM

## 2016-07-02 ENCOUNTER — Ambulatory Visit
Admission: RE | Admit: 2016-07-02 | Discharge: 2016-07-02 | Disposition: A | Discharge status: Home / Self Care | Payer: BLUE CROSS/BLUE SHIELD | Source: Ambulatory Visit | Attending: Family Medicine | Admitting: Family Medicine

## 2016-07-02 DIAGNOSIS — Z803 Family history of malignant neoplasm of breast: Secondary | ICD-10-CM

## 2016-07-02 DIAGNOSIS — N6489 Other specified disorders of breast: Secondary | ICD-10-CM

## 2016-08-07 ENCOUNTER — Telehealth: Payer: Self-pay | Admitting: Family Medicine

## 2016-08-07 ENCOUNTER — Other Ambulatory Visit: Payer: Self-pay | Admitting: *Deleted

## 2016-08-07 MED ORDER — NORGESTIM-ETH ESTRAD TRIPHASIC 0.18/0.215/0.25 MG-35 MCG PO TABS: 1.0000 | ORAL_TABLET | Freq: Every day | ORAL | 12 refills | Status: DC

## 2016-08-07 NOTE — Telephone Encounter (Signed)
Medication filled to pharmacy as requested.   

## 2016-08-07 NOTE — Telephone Encounter (Signed)
Patient calling to request refill of TRI-LINYAH 0.18/0.215/0.25 MG-35 MCG tablet.  Patient states she was informed by pharmacy to contact pcp office regarding refill request.  Pharmacy:   [facility] - [place],  - [address] [phone] (Phone) [phone] (Fax)

## 2016-09-26 ENCOUNTER — Other Ambulatory Visit: Payer: Self-pay | Admitting: Family Medicine

## 2016-10-26 ENCOUNTER — Ambulatory Visit (INDEPENDENT_AMBULATORY_CARE_PROVIDER_SITE_OTHER): Payer: Commercial Managed Care - HMO | Admitting: Family Medicine

## 2016-10-26 ENCOUNTER — Encounter: Payer: Self-pay | Admitting: Family Medicine

## 2016-10-26 VITALS — BP 118/83 | HR 73 | Temp 98.3°F | Resp 16 | Ht 63.0 in | Wt 169.4 lb

## 2016-10-26 DIAGNOSIS — J302 Other seasonal allergic rhinitis: Secondary | ICD-10-CM | POA: Diagnosis not present

## 2016-10-26 DIAGNOSIS — Z803 Family history of malignant neoplasm of breast: Secondary | ICD-10-CM | POA: Diagnosis not present

## 2016-10-26 MED ORDER — MONTELUKAST SODIUM 10 MG PO TABS: 10.0000 mg | ORAL_TABLET | Freq: Every day | ORAL | 3 refills | Status: DC

## 2016-10-26 NOTE — Patient Instructions (Signed)
Follow up as needed Start the Singulair once daily in addition to the Zyrtec Drink plenty of fluids If no improvement in allergies, let me know so we can refer for testing We'll call you with the Genetics appt for the breast cancer gene testing Call with any questions or concerns Happy Spring!!!

## 2016-10-26 NOTE — Progress Notes (Signed)
Pre visit review using our clinic review tool, if applicable. No additional management support is needed unless otherwise documented below in the visit note. 

## 2016-10-26 NOTE — Assessment & Plan Note (Signed)
Pt is not sure whether mom had BRCA testing at time of dx.  Pt is interested in pursuing this.  Referral placed.

## 2016-10-26 NOTE — Assessment & Plan Note (Signed)
Deteriorated.  Pt reports sxs are now year round and more severe than previously.  No improvement w/ nasal steroids.  On daily antihistamine.  Start Singulair daily.  If no improvement, will need allergy referral.  Pt expressed understanding and is in agreement w/ plan.

## 2016-10-26 NOTE — Progress Notes (Signed)
   Subjective:    Patient ID: Stacie Rosario, female    DOB: 11/27/1990, 26 y.o.   MRN: [id number]  HPI Allergies- chronic problem, 'it's so bad'.  Pt is currently taking Zyrtec BID w/ some improvement.  Pt reports allergies are now year round.  Reports red, itchy swollen, watery eyes.  + nasal congestion.  Was previously using Flonase and a 2nd nasal steroid w/o improvement.  Breast cancer testing- pt's mom was dx'd at age 67.  This was first female family member to get diagnosis.  Pt is interested in genetic testing as she is not sure if mom had BRCA-1 or 2 testing done.   Review of Systems For ROS see HPI     Objective:   Physical Exam  Constitutional: She is oriented to person, place, and time. She appears well-developed and well-nourished. No distress.  HENT:  Head: Normocephalic and atraumatic.  Right Ear: Tympanic membrane normal.  Left Ear: Tympanic membrane normal.  Nose: Mucosal edema and rhinorrhea present. Right sinus exhibits no maxillary sinus tenderness and no frontal sinus tenderness. Left sinus exhibits no maxillary sinus tenderness and no frontal sinus tenderness.  Mouth/Throat: Mucous membranes are normal. Posterior oropharyngeal erythema (w/ PND) present.  Eyes: Conjunctivae and EOM are normal. Pupils are equal, round, and reactive to light.  Neck: Normal range of motion. Neck supple.  Cardiovascular: Normal rate, regular rhythm and normal heart sounds.   Pulmonary/Chest: Effort normal and breath sounds normal. No respiratory distress. She has no wheezes. She has no rales.  Lymphadenopathy:    She has no cervical adenopathy.  Neurological: She is alert and oriented to person, place, and time.  Skin: Skin is warm and dry.  Psychiatric: She has a normal mood and affect. Her behavior is normal. Thought content normal.  Vitals reviewed.         Assessment & Plan:

## 2016-11-16 ENCOUNTER — Telehealth: Payer: Self-pay | Admitting: Genetics

## 2016-11-16 NOTE — Telephone Encounter (Signed)
Appt has been scheduled for the pt to see Tobi Bastosnna for genetic counseling on 3/20 at 10am. Demographics verified. Pt aware to arrive 15 minutes early.

## 2016-11-17 ENCOUNTER — Encounter: Payer: Self-pay | Admitting: Genetics

## 2016-11-17 ENCOUNTER — Other Ambulatory Visit: Payer: Commercial Managed Care - HMO

## 2016-11-17 ENCOUNTER — Ambulatory Visit (HOSPITAL_BASED_OUTPATIENT_CLINIC_OR_DEPARTMENT_OTHER): Payer: Commercial Managed Care - HMO | Admitting: Genetics

## 2016-11-17 DIAGNOSIS — Z803 Family history of malignant neoplasm of breast: Secondary | ICD-10-CM

## 2016-11-17 DIAGNOSIS — Z808 Family history of malignant neoplasm of other organs or systems: Secondary | ICD-10-CM

## 2016-11-17 DIAGNOSIS — Z801 Family history of malignant neoplasm of trachea, bronchus and lung: Secondary | ICD-10-CM | POA: Diagnosis not present

## 2016-11-17 DIAGNOSIS — Z7183 Encounter for nonprocreative genetic counseling: Secondary | ICD-10-CM | POA: Diagnosis not present

## 2016-11-17 NOTE — Progress Notes (Signed)
REFERRING PROVIDER: Sheliah Hatch, MD [address] [place], [place] [address]  PRIMARY PROVIDER:  Neena Rhymes, MD  PRIMARY REASON FOR VISIT:  No diagnosis found.   HISTORY OF PRESENT ILLNESS:   Stacie Rosario, a 26 y.o. female, was seen for a [facility] cancer genetics consultation at the request of Dr. Beverely Low due to a family history of cancer.  Stacie Rosario presents to clinic today to discuss the possibility of a hereditary predisposition to cancer, genetic testing, and to further clarify her future cancer risks, as well as potential cancer risks for family members.    Stacie Rosario is a 26 y.o. female with no personal history of cancer. She states that her mother was treated for breast cancer about 4 years ago and at that time, her mother's doctors told her that she would need to be tested. She presents today to follow-up on this recommendation.  CANCER HISTORY:   No history exists.    HORMONAL RISK FACTORS:  Menarche was at age 86.  First live birth at age nulliparous.  OCP use for approximately 10 years.  Ovaries intact: no.  Hysterectomy: no.  Menopausal status: premenopausal.  HRT use: 0 years. Colonoscopy: no; not examined. Mammogram within the last year: n/a. Number of breast biopsies: 0. Up to date with pelvic exams:  yes. Any excessive radiation exposure in the past:  no  Past Medical History:  Diagnosis Date  . Acne   . Allergic rhinitis   . Asthma     No past surgical history on file.  Social History   Social History  . Marital status: Single    Spouse name: N/A  . Number of children: N/A  . Years of education: N/A   Social History Main Topics  . Smoking status: Never Smoker  . Smokeless tobacco: Never Used     Comment: Passive smoke exposure  . Alcohol use No  . Drug use: No  . Sexual activity: Not on file   Other Topics Concern  . 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  Significant diagnoses are listed below: Family History  Problem Relation Age of Onset  . Depression Mother   . Thyroid disease Mother   . Breast cancer Mother 20    s/p bilateral mastectomy and hormone therapy for 1 year  . Arthritis Father   . Breast cancer Paternal Aunt 31  . Liver cancer Maternal Grandmother 44    h/o hepatitis and cirrhosis  . Lung cancer Maternal Grandfather 23    h/o smoking  . Ovarian cancer Paternal Grandmother 55  . Liver cancer Paternal Grandmother 92    thought to be new primary  . Throat cancer Paternal Grandfather 75  . Stomach cancer Paternal Grandfather 41   Stacie Rosario is an only child and has no children. Her mother was diagnosed with breast cancer at age 18 and is now doing well at age 68. Her treatment included bilateral mastectomies and hormone therapy for 1 year (she discontinued due to side-effects). Stacie Rosario's mother has two sisters (ages 103 and 31) and two brothers (45 and 72) without cancers. Stacie Rosario maternal grandmother was diagnosed with liver cancer at age 40 and died at 105. Her grandmother also had a history of hepatitis and cirrhosis. Stacie Rosario maternal grandfather was diagnosed with lung cancer at 75 and died  at 83. He had a history of smoking.  Stacie Rosario's father is 71 without cancers. He has two sisters (ages 32 and 63) and one brother (age 41). His oldest sister was diagnosed with breast cancer at 76. His other sister and brother have no history of cancers. Stacie Rosario paternal grandmother was diagnosed with ovarian cancer at 46 and liver cancer at 39. She died at age 42 from her cancers. Stacie Rosario believes that her maternal grandmother's liver cancer was a new primary rather than metastasis. Stacie Rosario's paternal grandfather was diagnosed with throat and stomach cancer at age 83 and died from his cancers at age 34.  There are no known cancers in Stacie Rosario's  first-cousins, great-aunts and great-uncles, or great-grandparents. However, Stacie Rosario's knowledge of her great-aunts, great-uncles, and great-grandparents is limited.  Stacie Rosario is unaware of previous family history of genetic testing for hereditary cancer risks. Patient's maternal ancestors are of Caucasian descent, and paternal ancestors are of Caucasian/Hungarian/Polish/Czech descent. There is no reported Ashkenazi Jewish ancestry. There is no known consanguinity.  GENETIC COUNSELING ASSESSMENT: Stacie Rosario is a 26 y.o. female with a paternal family history of cancers which is somewhat suggestive of hereditary cancer syndromes and predisposition to cancer. We, therefore, discussed and recommended the following at today's visit. We also discussed that based on current information, her maternal family history is not highly suggestive of a hereditary cancer syndrome. However, if she learns of additional maternal family members with cancers, this may change our risk assessment.   DISCUSSION: We reviewed the characteristics, features and inheritance patterns of hereditary cancer syndromes. We also discussed genetic testing, including the appropriate family members to test, the process of testing, insurance coverage and turn-around-time for results. We discussed the implications of a negative, positive and/or variant of uncertain significant result. We recommended Stacie Rosario pursue genetic testing for the 43-gene Common Hereditary Cancers Panel offered by [facility].   Based on Stacie Rosario's family history of cancer, she meets medical criteria for genetic testing. Despite that she meets criteria, she may still have an out of pocket cost. We discussed that if her out of pocket cost for testing is over $100, the laboratory will call and confirm whether she wants to proceed with testing.  If the out of pocket cost of testing is less than $100 she will be billed by the genetic testing laboratory.    Based on Stacie Rosario's personal and family history, statistical breast cancer risk model, Tyrer-Cuzick, was used to estimate her risk of developing breast cancer. This model estimates her lifetime risk of developing breast to be approximately 23.3%. The ACS recommends consideration of breast MRI screening as an adjunct to mammography for patients at high risk (defined as 20% or greater lifetime risk). We reviewed the following information regarding this estimate:  1) This estimation assumes that Ms. Calise's genetic testing results are negative. If Ms. Eveleth's genetic testing identifies a mutation, her cancer risks and management will likely be based on the presence of that mutation rather than this model, as it will likely confer a higher risk.  2) Based on the ages of onset of breast cancer in her family, Ms. Fermin should begin annual mammograms at age 21 (which is consistent with recommendations for the general population). Though her lifetime risk for breast cancer is currently estimated to be greater than 20%, this risk estimate may change by the time she is 39. This model bases risk on some yet-to-be-determined factors such as: age at first birth,  age at menopause, breast density, changes in family history, etc. Therefore, Ms. Doig was advised to consult her ordering physician regarding whether annual breast MRI is indicated at that time that she begins annual mammograms.  PLAN: After considering the risks, benefits, and limitations, Ms. Cloyd  provided informed consent to pursue genetic testing and the blood sample [facility] for analysis of the 43-gene Common Hereditary Cancers Panel. Results should be available within approximately 3 weeks' time, at which point they will be disclosed by telephone to Ms. Halter, as will any additional recommendations warranted by these results. Ms. Beatrix FettersCockrell will receive a summary of her genetic counseling visit and a copy of  her results once available. This information will also be available in Epic. We encouraged Ms. Silas to remain in contact with cancer genetics annually so that we can continuously update the family history and inform her of any changes in cancer genetics and testing that may be of benefit for her family. Ms. Lillia CarmelCockrell's questions were answered to her satisfaction today. Our contact information was provided should additional questions or concerns arise.  Based on Ms. Norment's family history, we also recommended her paternal aunt, who was diagnosed with breast cancer at age 27, have genetic counseling and testing as this would provide further context for Ms. Katzenstein's results and risk assessment. Ms. Beatrix FettersCockrell will let us know if we can be of any assistance in coordinating genetic counseling and/or testing for this family member.   Lastly, we encouraged Ms. Macdowell to remain in contact with cancer genetics annually so that we can continuously update the family history and inform her of any changes in cancer genetics and testing that may be of benefit for this family.   Ms.  Lillia CarmelCockrell's questions were answered to her satisfaction today. Our contact information was provided should additional questions or concerns arise. Thank you for the referral and allowing us to share in the care of your patient.   Rushie GoltzAnna Kaziyah Parkison, MS, [facility] Certified Armed forces training and education officerGenetic Counselor Aletta [email or web address] phone: [phone]  The patient was seen for a total of 35 minutes in face-to-face genetic counseling.  This patient was discussed with Drs. Magrinat, Pamelia HoitGudena and/or Mosetta PuttFeng who agrees with the above.    _______________________________________________________________________ For Office Staff:  Number of people involved in session: 1 Was an Intern/ student involved with case: no  Tyrer-Cuzick (IBIS) Breast Cancer Risk Model Summary

## 2016-11-25 DIAGNOSIS — Z1379 Encounter for other screening for genetic and chromosomal anomalies: Secondary | ICD-10-CM

## 2016-11-25 HISTORY — DX: Encounter for other screening for genetic and chromosomal anomalies: Z13.79

## 2016-11-26 ENCOUNTER — Other Ambulatory Visit: Payer: Self-pay | Admitting: General Practice

## 2016-11-26 MED ORDER — MONTELUKAST SODIUM 10 MG PO TABS: 10.0000 mg | ORAL_TABLET | Freq: Every day | ORAL | 0 refills | Status: DC

## 2016-11-26 MED ORDER — PRAMIPEXOLE DIHYDROCHLORIDE 0.25 MG PO TABS: 0.2500 mg | ORAL_TABLET | Freq: Every day | ORAL | 0 refills | Status: DC

## 2016-11-27 ENCOUNTER — Encounter: Payer: Self-pay | Admitting: Genetics

## 2016-11-27 ENCOUNTER — Telehealth: Payer: Self-pay | Admitting: Genetics

## 2016-11-27 ENCOUNTER — Ambulatory Visit: Payer: Self-pay | Admitting: Genetics

## 2016-11-27 DIAGNOSIS — Z1379 Encounter for other screening for genetic and chromosomal anomalies: Secondary | ICD-10-CM

## 2016-11-27 NOTE — Telephone Encounter (Signed)
Reviewed negative genetic testing.  Discussed that we do not know why there is cancer in the family. It could be due to a different gene that we are not testing, or maybe our current technology may not be able to detect all variants.  It will be important for her to keep in contact with genetics to keep up with whether additional testing may be needed. Result reported dated 11/25/2016. Please see my documentation from 11/27/2016 for more detailed discussion.

## 2016-11-27 NOTE — Progress Notes (Signed)
HPI: Ms. Mccowan was previously seen in the [facility] due to a family history of cancer and concerns regarding a hereditary predisposition to cancer. Please refer to our prior cancer genetics clinic note for more information regarding Ms. Lacerte's medical, social and family histories, and our assessment and recommendations, at the time. Ms. Diekman recent genetic test results were disclosed to her, as were recommendations warranted by these results. These results and recommendations are discussed in more detail below.  CANCER HISTORY:   No history exists.    FAMILY HISTORY:  We obtained a detailed, 4-generation family history.  Significant diagnoses are listed below: Family History  Problem Relation Age of Onset  . Depression Mother   . Thyroid disease Mother   . Breast cancer Mother 93    s/p bilateral mastectomy and hormone therapy for 1 year  . Arthritis Father   . Breast cancer Paternal Aunt 14  . Liver cancer Maternal Grandmother 65    h/o hepatitis and cirrhosis  . Lung cancer Maternal Grandfather 45    h/o smoking  . Ovarian cancer Paternal Grandmother 22  . Liver cancer Paternal Grandmother 40    thought to be new primary  . Throat cancer Paternal Grandfather 45  . Stomach cancer Paternal Grandfather 25    GENETIC TEST RESULTS: Genetic testing reported out on 11/25/2016 through the 43-gene Common Hereditary Cancer Panel by [facility] found no deleterious mutations. The test report will be scanned into EPIC and will be located under the Molecular Pathology section of the Results Review tab.A portion of her result report is included below for reference.     We discussed with Ms. Canterbury that since the current genetic testing is not perfect, it is possible there may be a gene mutation in one of these genes that current testing cannot detect, but that chance is small. We also discussed, that it is possible that another gene that has not yet been  discovered, or that we have not yet tested, is responsible for the cancer diagnoses in the family, and it is, therefore, important to remain in touch with cancer genetics in the future so that we can continue to offer Ms. Hefter the most up to date genetic testing.   CANCER SCREENING RECOMMENDATIONS: Given Ms. Wordell's family history of cancers, we must interpret these negative results with some caution.  Families with features suggestive of hereditary risk for cancer tend to have multiple family members with cancer, diagnoses in multiple generations and diagnoses before the age of 64. Ms. Seville's family exhibits some of these features. Thus this result may simply reflect our current inability to detect all mutations within these genes or there may be a different gene that has not yet been discovered or tested. We also discussed the possibility that Ms. Ewen's family members who have personal histories of cancer could carry a mutation that she did not inherit. Therefore, I recommended that her paternal aunt undergo genetic testing for her own risk assessment as well as to provide more context for interpretation of Ms. Strehle's results. Ms. Pellicane was advised to contact us with results if her her aunt undergoes genetic testing in the future.  In the meantime, we discussed that Ms. Fournier's breast cancer risk is above-average based on observed family history alone. Because all breast cancers in her family were diagnosed after age 65, Ms. Postlewaite was advised to begin annual mammograms at age 89 unless family history changes. At the time that she begins annual mammograms,  her lifetime breast cancer risk should be re-evaluated by a statistical breast cancer risk model to determine if high-risk screening (annual breast MRIs in addition to annual mammograms) is indicated. Based on the Ms. Pinder's current personal and family history of cancer, the Tyrer-Cuzick Breast Cancer Risk Model (IBIS)  estimates her lifetime risk to be 23.3%. Her lifetime breast cancer risk is a preliminary estimate based on available information using one of several models endorsed by the American Cancer Society (ACS). The ACS recommends consideration of breast MRI screening as an adjunct to mammography for patients at high risk (defined as 20% or greater lifetime risk).   RECOMMENDATIONS FOR FAMILY MEMBERS: Women in this family might be at some increased risk of developing cancer, over the general population risk, simply due to the family history of cancer. We recommended women in this family have a yearly mammogram beginning at age 38, or 67 years younger than the earliest onset of cancer, an annual clinical breast exam, and perform monthly breast self-exams. Women in this family should also have a gynecological exam as recommended by their primary provider. All family members should have a colonoscopy by age 48. Genetic testing is recommended for Ms. Santino's paternal aunt with breast cancer.  FOLLOW-UP: Lastly, we discussed with Ms. Rials that cancer genetics is a rapidly advancing field and it is possible that new genetic tests will be appropriate for her and/or her family members in the future. We encouraged her to remain in contact with cancer genetics on an annual basis so we can update her personal and family histories and let her know of advances in cancer genetics that may benefit this family.   Our contact number was provided. Ms. Casa questions were answered to her satisfaction, and she knows she is welcome to call us at anytime with additional questions or concerns.   Rushie Goltz, MS, [facility] Certified Armed forces training and education [email or web address] [phone]

## 2017-02-18 ENCOUNTER — Other Ambulatory Visit: Payer: Self-pay | Admitting: Family Medicine

## 2017-03-15 ENCOUNTER — Other Ambulatory Visit: Payer: Self-pay | Admitting: General Practice

## 2017-03-15 MED ORDER — NORGESTIM-ETH ESTRAD TRIPHASIC 0.18/0.215/0.25 MG-35 MCG PO TABS: 1.0000 | ORAL_TABLET | Freq: Every day | ORAL | 1 refills | Status: DC

## 2017-06-16 ENCOUNTER — Encounter: Payer: Self-pay | Admitting: Family Medicine

## 2017-06-16 ENCOUNTER — Ambulatory Visit (INDEPENDENT_AMBULATORY_CARE_PROVIDER_SITE_OTHER): Payer: Commercial Managed Care - HMO | Admitting: Family Medicine

## 2017-06-16 DIAGNOSIS — M62838 Other muscle spasm: Secondary | ICD-10-CM

## 2017-06-16 DIAGNOSIS — M542 Cervicalgia: Secondary | ICD-10-CM | POA: Diagnosis not present

## 2017-06-16 MED ORDER — CYCLOBENZAPRINE HCL 10 MG PO TABS: 10.0000 mg | ORAL_TABLET | Freq: Three times a day (TID) | ORAL | 0 refills | Status: DC | PRN

## 2017-06-16 MED ORDER — MELOXICAM 15 MG PO TABS: 15.0000 mg | ORAL_TABLET | Freq: Every day | ORAL | 1 refills | Status: DC
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
## 2017-06-16 NOTE — Patient Instructions (Signed)
Follow up as needed or as scheduled Start the Meloxicam once daily for pain and inflammation- take w/ food Use the Flexeril as needed for spasm- may cause drowsiness HEAT!!! Gentle stretching to avoid increased stiffness Call with any questions or concerns Hang in there!!!

## 2017-07-01 ENCOUNTER — Other Ambulatory Visit: Payer: Self-pay | Admitting: Family Medicine

## 2018-01-21 LAB — HM PAP SMEAR

## 2018-02-05 ENCOUNTER — Other Ambulatory Visit: Payer: Self-pay | Admitting: Family Medicine

## 2018-02-07 NOTE — Telephone Encounter (Signed)
Last OV: 06/11/2017 Last Fill: 10/2017  Ok to Refill.

## 2018-02-16 ENCOUNTER — Ambulatory Visit: Payer: Self-pay | Admitting: Family Medicine

## 2018-02-17 ENCOUNTER — Other Ambulatory Visit: Payer: Self-pay | Admitting: Family Medicine

## 2018-02-18 ENCOUNTER — Ambulatory Visit: Payer: Self-pay | Admitting: Family Medicine

## 2018-02-18 DIAGNOSIS — Z0289 Encounter for other administrative examinations: Secondary | ICD-10-CM

## 2018-04-12 ENCOUNTER — Telehealth: Payer: Self-pay | Admitting: Emergency Medicine

## 2018-04-12 NOTE — Telephone Encounter (Signed)
emailed our charge correction dept regarding fee.

## 2018-04-12 NOTE — Telephone Encounter (Signed)
I am not clear on the whole situation, but no charge

## 2018-04-12 NOTE — Telephone Encounter (Signed)
Please advise to keep late charge or disregard    Copied from CRM [phone]. Topic: Quick Communication - See Telephone Encounter >> Apr 12, 2018  7:50 AM Luanna Coleawoud, Jessica L wrote: CRM for notification. See Telephone encounter for: 04/12/18. Pt called and stated that she was charged a 50 late fee. Pt states that pervious appointment was was canceled due to provider needing to leave. Pt then rescheduled and stated that she would call back if she could not make. Pt was assured no late charge would be charged.

## 2018-06-06 ENCOUNTER — Encounter: Payer: Self-pay | Admitting: Family Medicine

## 2018-06-06 ENCOUNTER — Ambulatory Visit: Payer: Self-pay | Admitting: Family Medicine

## 2018-06-06 ENCOUNTER — Ambulatory Visit: Payer: BLUE CROSS/BLUE SHIELD | Admitting: Family Medicine

## 2018-06-06 ENCOUNTER — Other Ambulatory Visit: Payer: Self-pay

## 2018-06-06 VITALS — BP 116/80 | HR 79 | Temp 98.1°F | Resp 16 | Ht 63.0 in | Wt 179.4 lb

## 2018-06-06 DIAGNOSIS — K588 Other irritable bowel syndrome: Secondary | ICD-10-CM | POA: Diagnosis not present

## 2018-06-06 DIAGNOSIS — G2581 Restless legs syndrome: Secondary | ICD-10-CM

## 2018-06-06 DIAGNOSIS — Z3009 Encounter for other general counseling and advice on contraception: Secondary | ICD-10-CM

## 2018-06-06 DIAGNOSIS — K589 Irritable bowel syndrome without diarrhea: Secondary | ICD-10-CM | POA: Insufficient documentation

## 2018-06-06 MED ORDER — PROMETHAZINE HCL 25 MG PO TABS: 25.0000 mg | ORAL_TABLET | Freq: Four times a day (QID) | ORAL | 1 refills | Status: DC | PRN

## 2018-06-06 NOTE — Progress Notes (Signed)
   Subjective:    Patient ID: Stacie Rosario, female    DOB: [birth year], 27 y.o.   MRN: [id number]  HPI Birth control- pt is currently on pills and would prefer to go off hormonal contraceptive.  Pt has been on OCPs x11 yrs.  Mom is very concerned that it was OCPs that caused her breast cancer due to prolonged use of estrogen containing medication.  RLS- pt is having a difficult time sitting for 8 hrs due to RLS.  Has option to request medical accomodation for a sit/stand desk.  IBS- pt has had to miss work days due to IBS and was wondering if intermittent FMLA would be appropriate to serve as a safety net to avoid corrective action.  Pt's sxs are typically 'really bad stomach cramps' and vomiting.  Some associated diarrhea.  Had complete GI w/u for this in the past.   Review of Systems For ROS see HPI     Objective:   Physical Exam  Constitutional: She is oriented to person, place, and time. She appears well-developed and well-nourished. No distress.  HENT:  Head: Normocephalic and atraumatic.  Abdominal: Soft. Bowel sounds are normal. She exhibits no distension and no mass. There is no tenderness. There is no rebound and no guarding.  Neurological: She is alert and oriented to person, place, and time.  Skin: Skin is warm and dry.  Psychiatric: She has a normal mood and affect. Her behavior is normal. Thought content normal.  Vitals reviewed.         Assessment & Plan:  Birth control counseling- pt would like to stop hormone containing OCPs b/c of family hx of breast cancer.  Will refer to GYN for IUD discussion

## 2018-06-06 NOTE — Patient Instructions (Signed)
Schedule your complete physical at your convenience We'll call you with your GYN appt for the IUD consultation You can have work send me any needed accomodation paperwork for the sit/stand desk (fax [phone]) I am happy to complete intermittent FMLA for your IBS flares Call with any questions or concerns Happy Fall!!

## 2018-06-07 ENCOUNTER — Telehealth: Payer: Self-pay | Admitting: Family Medicine

## 2018-06-07 DIAGNOSIS — Z0279 Encounter for issue of other medical certificate: Secondary | ICD-10-CM

## 2018-06-07 NOTE — Telephone Encounter (Signed)
Paperwork given to PCP for completion.  

## 2018-06-07 NOTE — Telephone Encounter (Signed)
Received forms from patient by fax for work accommodations that was discussed durning appt on 10/7. Placed in bin upfront w/charge sheet.

## 2018-06-08 NOTE — Telephone Encounter (Signed)
Form completed and placed in basket  

## 2018-06-09 NOTE — Telephone Encounter (Signed)
Picked forms up from the back and faxed to the # listed on the forms.

## 2018-06-22 NOTE — Telephone Encounter (Signed)
Carollee Herter from [employer] is calling and needs to know how long the accommodations is  for or is accommodation permanent, Carollee Herter will fax the form to office

## 2018-06-22 NOTE — Telephone Encounter (Signed)
Will give paperwork to PCP when received through fax.

## 2018-06-27 NOTE — Telephone Encounter (Signed)
Form completed and placed in basket  

## 2018-06-27 NOTE — Telephone Encounter (Signed)
Checked with Shanda Bumps and the paperwork was received and has been placed in Dr. Rennis Golden box to be filled out.  I called patient to let her know that we have the paperwork and that it is in the box to be filled out.

## 2018-06-27 NOTE — Telephone Encounter (Signed)
Patient calling and was checking the status of the accommodations paperwork being received through fax. Please advise.

## 2018-06-27 NOTE — Telephone Encounter (Signed)
FYI

## 2018-06-28 NOTE — Telephone Encounter (Signed)
Picked  up and faxed  

## 2018-06-28 NOTE — Assessment & Plan Note (Signed)
Ongoing issue.  Pt has had to miss work for days of diarrhea and needs FMLA forms to avoid corrective action.  Forms will be completed when she submits them

## 2018-06-28 NOTE — Assessment & Plan Note (Signed)
Ongoing issue.  Having difficulty sitting for 8 hrs at her desk due to her sxs.  Asking if she can have an accomodation for a sit/stand desk to improve sxs.  Forms will be completed once submitted.

## 2018-07-01 NOTE — Telephone Encounter (Signed)
Called and gave the information to the claims people. Spoke with Jorie and she updated the claim.

## 2018-07-01 NOTE — Telephone Encounter (Signed)
Lifelong as far as we know

## 2018-07-01 NOTE — Telephone Encounter (Signed)
Stacie Rosario with [employer] called and stated that she would like the duration. she asked if this will be life long. Please advise [phone]

## 2018-07-01 NOTE — Telephone Encounter (Signed)
Ex [phone]

## 2018-07-01 NOTE — Telephone Encounter (Signed)
please advise.

## 2018-07-06 DIAGNOSIS — Z124 Encounter for screening for malignant neoplasm of cervix: Secondary | ICD-10-CM | POA: Diagnosis not present

## 2018-07-06 DIAGNOSIS — Z113 Encounter for screening for infections with a predominantly sexual mode of transmission: Secondary | ICD-10-CM | POA: Diagnosis not present

## 2018-07-06 DIAGNOSIS — Z01419 Encounter for gynecological examination (general) (routine) without abnormal findings: Secondary | ICD-10-CM | POA: Diagnosis not present

## 2018-07-06 DIAGNOSIS — Z3009 Encounter for other general counseling and advice on contraception: Secondary | ICD-10-CM | POA: Diagnosis not present

## 2018-07-15 ENCOUNTER — Other Ambulatory Visit: Payer: Self-pay | Admitting: Family Medicine

## 2018-08-10 ENCOUNTER — Other Ambulatory Visit: Payer: Self-pay | Admitting: Family Medicine

## 2018-09-02 DIAGNOSIS — T8332XA Displacement of intrauterine contraceptive device, initial encounter: Secondary | ICD-10-CM | POA: Insufficient documentation

## 2018-09-02 DIAGNOSIS — Z30430 Encounter for insertion of intrauterine contraceptive device: Secondary | ICD-10-CM | POA: Diagnosis not present

## 2018-09-30 DIAGNOSIS — Z975 Presence of (intrauterine) contraceptive device: Secondary | ICD-10-CM | POA: Diagnosis not present

## 2018-10-03 ENCOUNTER — Telehealth: Payer: Self-pay | Admitting: Family Medicine

## 2018-10-03 DIAGNOSIS — Z0279 Encounter for issue of other medical certificate: Secondary | ICD-10-CM

## 2018-10-03 NOTE — Telephone Encounter (Signed)
Noted, will work on completing as much as I can and give to PCP.

## 2018-10-03 NOTE — Telephone Encounter (Signed)
I have placed FMLA forms in the bin upfront with a charge sheet.  °

## 2018-10-04 NOTE — Telephone Encounter (Signed)
Form completed and placed in basket  

## 2018-10-04 NOTE — Telephone Encounter (Signed)
Picked up from the back and faxed  

## 2018-10-04 NOTE — Telephone Encounter (Signed)
fyi

## 2018-11-14 ENCOUNTER — Encounter: Payer: Self-pay | Admitting: Family Medicine

## 2019-02-02 ENCOUNTER — Other Ambulatory Visit: Payer: Self-pay | Admitting: Family Medicine

## 2019-03-29 DIAGNOSIS — L700 Acne vulgaris: Secondary | ICD-10-CM | POA: Diagnosis not present

## 2019-04-03 DIAGNOSIS — F4310 Post-traumatic stress disorder, unspecified: Secondary | ICD-10-CM | POA: Diagnosis not present

## 2019-04-11 DIAGNOSIS — F4310 Post-traumatic stress disorder, unspecified: Secondary | ICD-10-CM | POA: Diagnosis not present

## 2019-04-18 DIAGNOSIS — F4310 Post-traumatic stress disorder, unspecified: Secondary | ICD-10-CM | POA: Diagnosis not present

## 2019-04-24 DIAGNOSIS — F4310 Post-traumatic stress disorder, unspecified: Secondary | ICD-10-CM | POA: Diagnosis not present

## 2019-05-22 DIAGNOSIS — N920 Excessive and frequent menstruation with regular cycle: Secondary | ICD-10-CM | POA: Diagnosis not present

## 2019-05-22 DIAGNOSIS — Z975 Presence of (intrauterine) contraceptive device: Secondary | ICD-10-CM | POA: Diagnosis not present

## 2019-05-25 DIAGNOSIS — Z30431 Encounter for routine checking of intrauterine contraceptive device: Secondary | ICD-10-CM | POA: Diagnosis not present

## 2019-05-25 DIAGNOSIS — N920 Excessive and frequent menstruation with regular cycle: Secondary | ICD-10-CM | POA: Diagnosis not present

## 2019-05-26 DIAGNOSIS — N920 Excessive and frequent menstruation with regular cycle: Secondary | ICD-10-CM | POA: Diagnosis not present

## 2019-05-26 DIAGNOSIS — T8332XA Displacement of intrauterine contraceptive device, initial encounter: Secondary | ICD-10-CM | POA: Diagnosis not present

## 2019-05-26 DIAGNOSIS — Z3009 Encounter for other general counseling and advice on contraception: Secondary | ICD-10-CM | POA: Diagnosis not present

## 2019-05-26 DIAGNOSIS — Z803 Family history of malignant neoplasm of breast: Secondary | ICD-10-CM | POA: Diagnosis not present

## 2019-06-07 DIAGNOSIS — Z3009 Encounter for other general counseling and advice on contraception: Secondary | ICD-10-CM | POA: Diagnosis not present

## 2019-06-07 DIAGNOSIS — Z803 Family history of malignant neoplasm of breast: Secondary | ICD-10-CM | POA: Diagnosis not present

## 2019-06-07 DIAGNOSIS — T8332XA Displacement of intrauterine contraceptive device, initial encounter: Secondary | ICD-10-CM | POA: Diagnosis not present

## 2019-06-07 DIAGNOSIS — N946 Dysmenorrhea, unspecified: Secondary | ICD-10-CM | POA: Diagnosis not present

## 2019-06-12 DIAGNOSIS — Z01818 Encounter for other preprocedural examination: Secondary | ICD-10-CM | POA: Diagnosis not present

## 2019-06-15 DIAGNOSIS — J45909 Unspecified asthma, uncomplicated: Secondary | ICD-10-CM | POA: Diagnosis not present

## 2019-06-15 DIAGNOSIS — Z302 Encounter for sterilization: Secondary | ICD-10-CM | POA: Diagnosis not present

## 2019-06-15 DIAGNOSIS — T8332XA Displacement of intrauterine contraceptive device, initial encounter: Secondary | ICD-10-CM | POA: Diagnosis not present

## 2019-06-15 DIAGNOSIS — Z803 Family history of malignant neoplasm of breast: Secondary | ICD-10-CM | POA: Diagnosis not present

## 2019-06-15 DIAGNOSIS — T8332XD Displacement of intrauterine contraceptive device, subsequent encounter: Secondary | ICD-10-CM | POA: Diagnosis not present

## 2019-06-15 DIAGNOSIS — Z79899 Other long term (current) drug therapy: Secondary | ICD-10-CM | POA: Diagnosis not present

## 2019-06-15 DIAGNOSIS — N946 Dysmenorrhea, unspecified: Secondary | ICD-10-CM | POA: Diagnosis not present

## 2019-06-15 DIAGNOSIS — N809 Endometriosis, unspecified: Secondary | ICD-10-CM | POA: Diagnosis not present

## 2019-06-15 DIAGNOSIS — N838 Other noninflammatory disorders of ovary, fallopian tube and broad ligament: Secondary | ICD-10-CM | POA: Diagnosis not present

## 2019-06-15 DIAGNOSIS — Z881 Allergy status to other antibiotic agents status: Secondary | ICD-10-CM | POA: Diagnosis not present

## 2019-06-28 DIAGNOSIS — L700 Acne vulgaris: Secondary | ICD-10-CM | POA: Diagnosis not present

## 2019-09-04 ENCOUNTER — Other Ambulatory Visit: Payer: Self-pay | Admitting: Family Medicine

## 2019-09-25 DIAGNOSIS — L700 Acne vulgaris: Secondary | ICD-10-CM | POA: Diagnosis not present

## 2019-12-22 ENCOUNTER — Encounter: Payer: Self-pay | Admitting: Family Medicine

## 2019-12-22 ENCOUNTER — Telehealth (INDEPENDENT_AMBULATORY_CARE_PROVIDER_SITE_OTHER): Payer: BC Managed Care – PPO | Admitting: Family Medicine

## 2019-12-22 ENCOUNTER — Other Ambulatory Visit: Payer: Self-pay

## 2019-12-22 DIAGNOSIS — K588 Other irritable bowel syndrome: Secondary | ICD-10-CM

## 2019-12-22 DIAGNOSIS — F419 Anxiety disorder, unspecified: Secondary | ICD-10-CM

## 2019-12-22 DIAGNOSIS — J302 Other seasonal allergic rhinitis: Secondary | ICD-10-CM

## 2019-12-22 MED ORDER — PROMETHAZINE HCL 25 MG PO TABS: 25.0000 mg | ORAL_TABLET | Freq: Four times a day (QID) | ORAL | 1 refills | Status: DC | PRN

## 2019-12-22 MED ORDER — ESCITALOPRAM OXALATE 10 MG PO TABS: 10.0000 mg | ORAL_TABLET | Freq: Every day | ORAL | 3 refills | Status: DC

## 2019-12-22 MED ORDER — ALPRAZOLAM 0.25 MG PO TABS: ORAL_TABLET | ORAL | 1 refills | Status: DC

## 2019-12-22 NOTE — Progress Notes (Signed)
Virtual Visit via Video   I connected with patient on 12/22/19 at 11:00 AM EDT by a video enabled telemedicine application and verified that I am speaking with the correct person using two identifiers.  Location patient: Home Location provider: [facility], Office Persons participating in the virtual visit: Patient, Provider, CMA (Jess B)  I discussed the limitations of evaluation and management by telemedicine and the availability of in person appointments. The patient expressed understanding and agreed to proceed.  Subjective:   HPI:   Allergic Rhinitis- currently on Zyrtec 10mg  daily and Singulair 10mg  daily.  This controlled sxs last year but not this year.  Pt reports increased nasal congestion, itchy eyes, and itchy throat.  Not currently on nasal spray.  No swelling of lips/tongue.    Anxiety- pt reports increased anxiety recently (~2 months).  Feels this has worsened her IBS and nausea.  Previously anxiety was 'only at night' but is now 'all days most days'.  Increased palpitations, irritability, feels 'on edge'.     ROS:   See pertinent positives and negatives per HPI.  Patient Active Problem List   Diagnosis Date Noted  . IBS (irritable bowel syndrome) 06/06/2018  . Genetic testing 11/25/2016  . Family history of breast cancer in mother 10/26/2016  . Weight gain 10/03/2015  . Stone of salivary gland or duct 08/01/2015  . Allergic rhinitis 11/24/2012  . RLS (restless legs syndrome) 08/10/2012  . Anxiety 05/15/2011  . Physical exam 03/23/2011  . Screening for malignant neoplasm of cervix 03/23/2011  . HYPERLIPIDEMIA 01/24/2010  . ASTHMA 12/21/2007  . ACNE NEC 05/16/2007    Social History   Tobacco Use  . Smoking status: Never Smoker  . Smokeless tobacco: Never Used  . Tobacco comment: Passive smoke exposure  Substance Use Topics  . Alcohol use: No    Current Outpatient Medications:  .  ALPRAZolam (XANAX) 0.25 MG tablet, TAKE 1 TABLET BY MOUTH  THREE TIMES DAILY AS NEEDED FOR ANXIETY, Disp: 30 tablet, Rfl: 1 .  cetirizine (ZYRTEC) 10 MG tablet, Take 10 mg by mouth daily. , Disp: , Rfl:  .  montelukast (SINGULAIR) 10 MG tablet, TAKE 1 TABLET BY MOUTH EVERYDAY AT BEDTIME, Disp: 90 tablet, Rfl: 1 .  Multiple Vitamin (MULTIVITAMIN) tablet, Take 1 tablet by mouth daily., Disp: , Rfl:  .  pramipexole (MIRAPEX) 0.25 MG tablet, TAKE 1 TABLET BY MOUTH EVERYDAY AT BEDTIME, Disp: 90 tablet, Rfl: 1 .  promethazine (PHENERGAN) 25 MG tablet, Take 1 tablet (25 mg total) by mouth every 6 (six) hours as needed., Disp: 30 tablet, Rfl: 1 .  spironolactone (ALDACTONE) 50 MG tablet, , Disp: , Rfl:   Allergies  Allergen Reactions  . Biaxin [Clarithromycin] Hives, Swelling and Nausea And Vomiting    Objective:   There were no vitals taken for this visit. AAOx3, NAD NCAT, EOMI No obvious CN deficits Coloring WNL Pt is able to speak clearly, coherently without shortness of breath or increased work of breathing.  Thought process is linear.  Mood is appropriate.   Assessment and Plan:   Allergic Rhinitis- deteriorated.  No longer controlled w/ Zyrtec and Singulair.  Will add nasal steroid.  Anxiety- deteriorated.  Sxs are now occurring day and night.  Asking for refill on Alprazolam.  Told her that while filling the Alprazolam is an option, we also need to treat the day to day underlying anxiety.  Pt agreeable to this.  Will start Lexapro.  IBS- worsened w/ increased anxiety.  Asking  for refill on Phenergan.  Prescription sent.   Annye Asa, MD 12/22/2019

## 2019-12-22 NOTE — Progress Notes (Signed)
I have discussed the procedure for the virtual visit with the patient who has given consent to proceed with assessment and treatment.   Pt unable to obtain vitals.   Jessica L Brodmerkel, CMA     

## 2019-12-25 ENCOUNTER — Ambulatory Visit: Payer: BLUE CROSS/BLUE SHIELD | Admitting: Family Medicine

## 2020-01-13 ENCOUNTER — Other Ambulatory Visit: Payer: Self-pay | Admitting: Family Medicine

## 2020-01-22 ENCOUNTER — Encounter: Payer: Self-pay | Admitting: Family Medicine

## 2020-01-22 ENCOUNTER — Telehealth (INDEPENDENT_AMBULATORY_CARE_PROVIDER_SITE_OTHER): Payer: BC Managed Care – PPO | Admitting: Family Medicine

## 2020-01-22 ENCOUNTER — Other Ambulatory Visit: Payer: Self-pay

## 2020-01-22 DIAGNOSIS — F419 Anxiety disorder, unspecified: Secondary | ICD-10-CM | POA: Diagnosis not present

## 2020-01-22 MED ORDER — FLUOXETINE HCL 20 MG PO CAPS
20.0000 mg | ORAL_CAPSULE | Freq: Every day | ORAL | 3 refills | Status: DC
Start: 2020-01-22 — End: 2020-02-19

## 2020-01-22 NOTE — Progress Notes (Signed)
Virtual Visit via Video   I connected with patient on 01/22/20 at 11:30 AM EDT by a video enabled telemedicine application and verified that I am speaking with the correct person using two identifiers.  Location patient: Home Location provider: [facility], Office Persons participating in the virtual visit: Patient, Provider, CMA (Jess B)  I discussed the limitations of evaluation and management by telemedicine and the availability of in person appointments. The patient expressed understanding and agreed to proceed.  Subjective:   HPI:   Anxiety- was started on Lexapro at last visit.  'i'm really tired but I can't sleep'.  No improvement in anxiety or mood but then reports 'it's not quite as bad as it was before'.  Feels this is due more to outside circumstances than medication.  Anxiety is still 'nearly constant' but 'not as overwhelming'.  Pt doesn't recall taking any other anxiety medication but remembers taking Lexapro and having weight gain- which is worsening her anxiety about taking this medication.  ROS:   See pertinent positives and negatives per HPI.  Patient Active Problem List   Diagnosis Date Noted  . IBS (irritable bowel syndrome) 06/06/2018  . Genetic testing 11/25/2016  . Family history of breast cancer in mother 10/26/2016  . Weight gain 10/03/2015  . Stone of salivary gland or duct 08/01/2015  . Allergic rhinitis 11/24/2012  . RLS (restless legs syndrome) 08/10/2012  . Anxiety 05/15/2011  . Physical exam 03/23/2011  . Screening for malignant neoplasm of cervix 03/23/2011  . HYPERLIPIDEMIA 01/24/2010  . ASTHMA 12/21/2007  . ACNE NEC 05/16/2007    Social History   Tobacco Use  . Smoking status: Never Smoker  . Smokeless tobacco: Never Used  . Tobacco comment: Passive smoke exposure  Substance Use Topics  . Alcohol use: No    Current Outpatient Medications:  .  ALPRAZolam (XANAX) 0.25 MG tablet, TAKE 1 TABLET BY MOUTH THREE TIMES DAILY AS  NEEDED FOR ANXIETY, Disp: 30 tablet, Rfl: 1 .  cetirizine (ZYRTEC) 10 MG tablet, Take 10 mg by mouth daily. , Disp: , Rfl:  .  escitalopram (LEXAPRO) 10 MG tablet, TAKE 1 TABLET BY MOUTH EVERY DAY, Disp: 90 tablet, Rfl: 2 .  montelukast (SINGULAIR) 10 MG tablet, TAKE 1 TABLET BY MOUTH EVERYDAY AT BEDTIME, Disp: 90 tablet, Rfl: 1 .  Multiple Vitamin (MULTIVITAMIN) tablet, Take 1 tablet by mouth daily., Disp: , Rfl:  .  pramipexole (MIRAPEX) 0.25 MG tablet, TAKE 1 TABLET BY MOUTH EVERYDAY AT BEDTIME, Disp: 90 tablet, Rfl: 1 .  promethazine (PHENERGAN) 25 MG tablet, Take 1 tablet (25 mg total) by mouth every 6 (six) hours as needed., Disp: 30 tablet, Rfl: 1 .  spironolactone (ALDACTONE) 50 MG tablet, , Disp: , Rfl:   Allergies  Allergen Reactions  . Biaxin [Clarithromycin] Hives, Swelling and Nausea And Vomiting    Objective:   There were no vitals taken for this visit. AAOx3, NAD NCAT, EOMI No obvious CN deficits Coloring WNL Pt is able to speak clearly, coherently without shortness of breath or increased work of breathing.  Thought process is linear.  Mood is appropriate.   Assessment and Plan:   Anxiety- pt reports some situational improvement but doesn't feel the Lexapro has been helpful.  She also has increased anxiety bc she feels that she gained weight when she took this previously.  Will switch from Lexapro to Prozac and follow closely.  Pt expressed understanding and is in agreement w/ plan.    Neena Rhymes, MD  01/22/2020   

## 2020-01-22 NOTE — Progress Notes (Signed)
I have discussed the procedure for the virtual visit with the patient who has given consent to proceed with assessment and treatment.   Pt unable to obtain vitals.   Pt does not feel as though there has been a change in her anxiety on the Lexapro. Pt is anxious about being on the medication as it made her gain a lot of weight in college and she seems to be fixated on that issue.   Geannie Risen, CMA

## 2020-01-31 DIAGNOSIS — Z03818 Encounter for observation for suspected exposure to other biological agents ruled out: Secondary | ICD-10-CM | POA: Diagnosis not present

## 2020-01-31 DIAGNOSIS — Z20828 Contact with and (suspected) exposure to other viral communicable diseases: Secondary | ICD-10-CM | POA: Diagnosis not present

## 2020-02-18 ENCOUNTER — Other Ambulatory Visit: Payer: Self-pay | Admitting: Family Medicine

## 2020-02-21 ENCOUNTER — Other Ambulatory Visit: Payer: Self-pay

## 2020-02-21 ENCOUNTER — Encounter: Payer: Self-pay | Admitting: Family Medicine

## 2020-02-21 ENCOUNTER — Telehealth (INDEPENDENT_AMBULATORY_CARE_PROVIDER_SITE_OTHER): Payer: BC Managed Care – PPO | Admitting: Family Medicine

## 2020-02-21 DIAGNOSIS — F419 Anxiety disorder, unspecified: Secondary | ICD-10-CM | POA: Diagnosis not present

## 2020-02-21 MED ORDER — BUPROPION HCL ER (XL) 150 MG PO TB24: 150.0000 mg | ORAL_TABLET | Freq: Every day | ORAL | 3 refills | Status: DC

## 2020-02-21 NOTE — Progress Notes (Signed)
° °  Virtual Visit via Video   I connected with patient on 02/21/20 at  2:00 PM EDT by a video enabled telemedicine application and verified that I am speaking with the correct person using two identifiers.  Location patient: Home Location provider: [facility], Office Persons participating in the virtual visit: Patient, Provider, CMA (Jess B)  I discussed the limitations of evaluation and management by telemedicine and the availability of in person appointments. The patient expressed understanding and agreed to proceed.  Subjective:   HPI:   Anxiety- At last visit pt was switched from Lexapro to Prozac.  Pt reports anxiety is better.  Pt reports weight has been consistent.  Only current side effect is 'my libido is absolutely zero'.  ROS:   See pertinent positives and negatives per HPI.  Patient Active Problem List   Diagnosis Date Noted   IBS (irritable bowel syndrome) 06/06/2018   Genetic testing 11/25/2016   Family history of breast cancer in mother 10/26/2016   Weight gain 10/03/2015   Stone of salivary gland or duct 08/01/2015   Allergic rhinitis 11/24/2012   RLS (restless legs syndrome) 08/10/2012   Anxiety 05/15/2011   Physical exam 03/23/2011   Screening for malignant neoplasm of cervix 03/23/2011   HYPERLIPIDEMIA 01/24/2010   ASTHMA 12/21/2007   ACNE NEC 05/16/2007    Social History   Tobacco Use   Smoking status: Never Smoker   Smokeless tobacco: Never Used   Tobacco comment: Passive smoke exposure  Substance Use Topics   Alcohol use: No    Current Outpatient Medications:    ALPRAZolam (XANAX) 0.25 MG tablet, TAKE 1 TABLET BY MOUTH THREE TIMES DAILY AS NEEDED FOR ANXIETY, Disp: 30 tablet, Rfl: 1   cetirizine (ZYRTEC) 10 MG tablet, Take 10 mg by mouth daily. , Disp: , Rfl:    FLUoxetine (PROZAC) 20 MG capsule, TAKE 1 CAPSULE BY MOUTH EVERY DAY, Disp: 90 capsule, Rfl: 1   montelukast (SINGULAIR) 10 MG tablet, TAKE 1 TABLET BY  MOUTH EVERYDAY AT BEDTIME, Disp: 90 tablet, Rfl: 1   Multiple Vitamin (MULTIVITAMIN) tablet, Take 1 tablet by mouth daily., Disp: , Rfl:    pramipexole (MIRAPEX) 0.25 MG tablet, TAKE 1 TABLET BY MOUTH EVERYDAY AT BEDTIME, Disp: 90 tablet, Rfl: 1   promethazine (PHENERGAN) 25 MG tablet, Take 1 tablet (25 mg total) by mouth every 6 (six) hours as needed., Disp: 30 tablet, Rfl: 1   spironolactone (ALDACTONE) 50 MG tablet, , Disp: , Rfl:   Allergies  Allergen Reactions   Biaxin [Clarithromycin] Hives, Swelling and Nausea And Vomiting    Objective:   There were no vitals taken for this visit. AAOx3, NAD NCAT, EOMI No obvious CN deficits Coloring WNL Pt is able to speak clearly, coherently without shortness of breath or increased work of breathing.  Thought process is linear.  Mood is appropriate.   Assessment and Plan:   Anxiety- improved since switching from Lexapro to Prozac but her libido has greatly suffered.  B/c of this, will start Wellbutrin and overlap for 1-2 weeks while weaning Prozac.  Pt expressed understanding and is in agreement w/ plan.  Will follow to ensure anxiety remains controlled   Neena Rhymes, MD 02/21/2020 .

## 2020-02-21 NOTE — Progress Notes (Signed)
I have discussed the procedure for the virtual visit with the patient who has given consent to proceed with assessment and treatment.   Pt unable to obtain vitals.   Rodell Marrs L Sun Kihn, CMA     

## 2020-03-02 ENCOUNTER — Other Ambulatory Visit: Payer: Self-pay | Admitting: Family Medicine

## 2020-03-15 ENCOUNTER — Other Ambulatory Visit: Payer: Self-pay | Admitting: Family Medicine

## 2020-04-09 ENCOUNTER — Other Ambulatory Visit: Payer: Self-pay | Admitting: Family Medicine

## 2020-09-06 ENCOUNTER — Other Ambulatory Visit: Payer: Self-pay | Admitting: Family Medicine

## 2020-09-09 ENCOUNTER — Other Ambulatory Visit: Payer: Self-pay | Admitting: Family Medicine

## 2020-09-23 DIAGNOSIS — L700 Acne vulgaris: Secondary | ICD-10-CM | POA: Diagnosis not present

## 2020-10-11 ENCOUNTER — Encounter: Payer: Self-pay | Admitting: Family Medicine

## 2020-10-11 NOTE — Telephone Encounter (Signed)
LM asking pt to call back to schedule an appt [facility]  °

## 2020-10-14 ENCOUNTER — Telehealth: Payer: Self-pay | Admitting: Family Medicine

## 2020-10-14 NOTE — Telephone Encounter (Signed)
I have place LOA forms in the bin up front with a charge sheet. Pt has an appt to discuss these forms with Tabori on 10/21/20.

## 2020-10-15 NOTE — Telephone Encounter (Signed)
Forms placed in [facility] bin in back office to be filled. 

## 2020-10-15 NOTE — Telephone Encounter (Signed)
FYI

## 2020-10-21 ENCOUNTER — Telehealth (INDEPENDENT_AMBULATORY_CARE_PROVIDER_SITE_OTHER): Payer: BC Managed Care – PPO | Admitting: Family Medicine

## 2020-10-21 ENCOUNTER — Encounter: Payer: Self-pay | Admitting: Family Medicine

## 2020-10-21 DIAGNOSIS — F418 Other specified anxiety disorders: Secondary | ICD-10-CM | POA: Diagnosis not present

## 2020-10-21 MED ORDER — VENLAFAXINE HCL ER 37.5 MG PO CP24: 37.5000 mg | ORAL_CAPSULE | Freq: Every day | ORAL | 3 refills | Status: DC

## 2020-10-21 NOTE — Progress Notes (Signed)
I connected with  Stacie Rosario on 10/21/20 by a video enabled telemedicine application and verified that I am speaking with the correct person using two identifiers.   I discussed the limitations of evaluation and management by telemedicine. The patient expressed understanding and agreed to proceed.

## 2020-10-21 NOTE — Progress Notes (Signed)
Virtual Visit via Video   I connected with patient on 10/21/20 at  8:00 AM EST by a video enabled telemedicine application and verified that I am speaking with the correct person using two identifiers.  Location patient: Home Location provider: [place] [facility], Office Persons participating in the virtual visit: Patient, Provider, CMA (Sabrina M)  I discussed the limitations of evaluation and management by telemedicine and the availability of in person appointments. The patient expressed understanding and agreed to proceed.  Subjective:   HPI:   Anxiety/Depression- pt is currently on Wellbutrin 150mg  daily.  She was previously on Fluoxetine 20mg  daily but reports she is no longer taking this.  Pt reports that sxs have worsened over the last 2-3 months.  'i'm more anxious and depressed'.  Pt has tried increased self care but this has not helped.  + increased irritability.  'unable to let go of the little things'.  In the last month, her sxs have started impacting her work life- she is a and interviews people all day long.  She feels she is unable to be objective when interviewing 'bc they will say something small that will make me irrationally angry or irrationally sad'.  Is in counseling every other week.  Pt has been on Lexapro (weight gain) and Prozac (ineffective) previously.  No thoughts of self harm.  Feels she has a good support system.  ROS:   See pertinent positives and negatives per HPI.  Patient Active Problem List   Diagnosis Date Noted  . Displacement of intrauterine contraceptive device 09/02/2018  . IBS (irritable bowel syndrome) 06/06/2018  . Genetic testing 11/25/2016  . Family history of breast cancer in mother 10/26/2016  . Weight gain 10/03/2015  . Stone of salivary gland or duct 08/01/2015  . Allergic rhinitis 11/24/2012  . RLS (restless legs syndrome) 08/10/2012  . Anxiety 05/15/2011  . Physical exam 03/23/2011  . Screening for malignant neoplasm  of cervix 03/23/2011  . HYPERLIPIDEMIA 01/24/2010  . ASTHMA 12/21/2007  . Asthma 12/21/2007  . ACNE NEC 05/16/2007    Social History   Tobacco Use  . Smoking status: Never Smoker  . Smokeless tobacco: Never Used  . Tobacco comment: Passive smoke exposure  Substance Use Topics  . Alcohol use: No    Current Outpatient Medications:  .  ALPRAZolam (XANAX) 0.25 MG tablet, TAKE 1 TABLET BY MOUTH THREE TIMES DAILY AS NEEDED FOR ANXIETY, Disp: 30 tablet, Rfl: 1 .  buPROPion (WELLBUTRIN XL) 150 MG 24 hr tablet, TAKE 1 TABLET BY MOUTH EVERY DAY, Disp: 90 tablet, Rfl: 2 .  cetirizine (ZYRTEC) 10 MG tablet, Take 10 mg by mouth daily., Disp: , Rfl:  .  montelukast (SINGULAIR) 10 MG tablet, TAKE 1 TABLET BY MOUTH EVERYDAY AT BEDTIME, Disp: 90 tablet, Rfl: 1 .  Multiple Vitamin (MULTIVITAMIN) tablet, Take 1 tablet by mouth daily., Disp: , Rfl:  .  pramipexole (MIRAPEX) 0.25 MG tablet, TAKE 1 TABLET BY MOUTH EVERYDAY AT BEDTIME, Disp: 90 tablet, Rfl: 0 .  promethazine (PHENERGAN) 25 MG tablet, Take 1 tablet (25 mg total) by mouth every 6 (six) hours as needed., Disp: 30 tablet, Rfl: 1 .  spironolactone (ALDACTONE) 50 MG tablet, , Disp: , Rfl:  .  FLUoxetine (PROZAC) 20 MG capsule, TAKE 1 CAPSULE BY MOUTH EVERY DAY (Patient not taking: Reported on 10/21/2020), Disp: 90 capsule, Rfl: 1  Allergies  Allergen Reactions  . Biaxin [Clarithromycin] Hives, Swelling and Nausea And Vomiting    Objective:   There  were no vitals taken for this visit. AAOx3, NAD NCAT, EOMI No obvious CN deficits Coloring WNL Pt is able to speak clearly, coherently without shortness of breath or increased work of breathing.  Thought process is linear.  Mood is appropriate.   Assessment and Plan:   Depression/anxiety- deteriorated.  Pt has always struggled w/ anxiety but now her depression is also an issue.  She is increasingly irritable, angry, tearful, sad.  Father was in a terrible car accident and now has TBI.  She is  struggling with all that's going on.  Will start SNRI as Lexapro caused weight gain and Prozac was ineffective.  Will start Venlafaxine 37.5mg  daily and titrate as needed.  She will take a leave of absence starting today until 4/4 to allow for new medication and adjustments.  Will follow closely.  Pt expressed understanding and is in agreement w/ plan.   Neena Rhymes, MD 10/21/2020

## 2020-10-22 ENCOUNTER — Other Ambulatory Visit: Payer: Self-pay | Admitting: Family Medicine

## 2020-10-22 ENCOUNTER — Telehealth: Payer: Self-pay | Admitting: Family Medicine

## 2020-10-22 NOTE — Telephone Encounter (Signed)
..  Type of form received:  FMLA  Additional comments:   Received by:  Gus Height should be Faxed to:  [phone] Form should be mailed to:    Is patient requesting call for pickup:   Form placed:  In Dr. Rennis Golden bin up front Attach charge sheet.  Provider will determine charge.  Individual made aware of 3-5 business day turn around (Y/N)?

## 2020-10-22 NOTE — Telephone Encounter (Signed)
Form in your folder for review and completion

## 2020-10-29 ENCOUNTER — Telehealth: Payer: Self-pay | Admitting: Family Medicine

## 2020-10-29 NOTE — Telephone Encounter (Signed)
LM asking pt to call back to schedule a 3-4 wk reck mood from her 10/21/20 appt with Dr. Beverely Low.

## 2020-11-04 DIAGNOSIS — Z0279 Encounter for issue of other medical certificate: Secondary | ICD-10-CM

## 2020-11-04 NOTE — Telephone Encounter (Signed)
She states they are due by 11/10/20

## 2020-11-04 NOTE — Telephone Encounter (Signed)
Form completed and faxed to [phone] and also the number listed below.

## 2020-11-04 NOTE — Telephone Encounter (Signed)
Form completed and placed in basket  

## 2020-11-04 NOTE — Telephone Encounter (Signed)
Made pt aware

## 2020-11-07 ENCOUNTER — Telehealth: Payer: BC Managed Care – PPO | Admitting: Family Medicine

## 2020-11-11 ENCOUNTER — Telehealth: Payer: BC Managed Care – PPO | Admitting: Family Medicine

## 2020-11-13 ENCOUNTER — Other Ambulatory Visit: Payer: Self-pay | Admitting: Family Medicine

## 2020-11-14 ENCOUNTER — Telehealth (INDEPENDENT_AMBULATORY_CARE_PROVIDER_SITE_OTHER): Payer: BC Managed Care – PPO | Admitting: Family Medicine

## 2020-11-14 ENCOUNTER — Encounter: Payer: Self-pay | Admitting: Family Medicine

## 2020-11-14 DIAGNOSIS — F418 Other specified anxiety disorders: Secondary | ICD-10-CM | POA: Diagnosis not present

## 2020-11-14 MED ORDER — VENLAFAXINE HCL ER 75 MG PO CP24: 75.0000 mg | ORAL_CAPSULE | Freq: Every day | ORAL | 3 refills | Status: DC

## 2020-11-14 NOTE — Progress Notes (Signed)
I connected with  Stacie Rosario on 11/14/20 by a video enabled telemedicine application and verified that I am speaking with the correct person using two identifiers.   I discussed the limitations of evaluation and management by telemedicine. The patient expressed understanding and agreed to proceed.

## 2020-11-14 NOTE — Progress Notes (Signed)
Virtual Visit via Video   I connected with patient on 11/14/20 at  1:00 PM EDT by a video enabled telemedicine application and verified that I am speaking with the correct person using two identifiers.  Location patient: Home Location provider: [place] [facility], Office Persons participating in the virtual visit: Patient, Provider, CMA (Sabrina M)  I discussed the limitations of evaluation and management by telemedicine and the availability of in person appointments. The patient expressed understanding and agreed to proceed.  Subjective:   HPI:   Depression w/ anxiety- pt was switched to Venlafaxine at last visit.  Pt has noticed improvement in anxiety.  Not sure if this is due to medication or time away from work.  Minimal, if any, improvement in depressive sxs.  PHQ=12 today.  Pt denies side effects from Venlafaxine.  Remains on Wellbutrin 150mg  daily.  Some improvement in anger and irritability but thinks this is due to time off.  Is considering a new job.  Continues to meet w/ counselor.  ROS:   See pertinent positives and negatives per HPI.  Patient Active Problem List   Diagnosis Date Noted  . Displacement of intrauterine contraceptive device 09/02/2018  . IBS (irritable bowel syndrome) 06/06/2018  . Genetic testing 11/25/2016  . Family history of breast cancer in mother 10/26/2016  . Weight gain 10/03/2015  . Stone of salivary gland or duct 08/01/2015  . Allergic rhinitis 11/24/2012  . RLS (restless legs syndrome) 08/10/2012  . Anxiety 05/15/2011  . Physical exam 03/23/2011  . Screening for malignant neoplasm of cervix 03/23/2011  . HYPERLIPIDEMIA 01/24/2010  . ASTHMA 12/21/2007  . Asthma 12/21/2007  . ACNE NEC 05/16/2007    Social History   Tobacco Use  . Smoking status: Never Smoker  . Smokeless tobacco: Never Used  . Tobacco comment: Passive smoke exposure  Substance Use Topics  . Alcohol use: No    Current Outpatient Medications:  .  ALPRAZolam  (XANAX) 0.25 MG tablet, TAKE 1 TABLET BY MOUTH 3 TIMES A DAY AS NEEDED FOR ANXIETY, Disp: 30 tablet, Rfl: 3 .  buPROPion (WELLBUTRIN XL) 150 MG 24 hr tablet, TAKE 1 TABLET BY MOUTH EVERY DAY, Disp: 90 tablet, Rfl: 2 .  cetirizine (ZYRTEC) 10 MG tablet, Take 10 mg by mouth daily., Disp: , Rfl:  .  montelukast (SINGULAIR) 10 MG tablet, TAKE 1 TABLET BY MOUTH EVERYDAY AT BEDTIME, Disp: 90 tablet, Rfl: 1 .  Multiple Vitamin (MULTIVITAMIN) tablet, Take 1 tablet by mouth daily., Disp: , Rfl:  .  pramipexole (MIRAPEX) 0.25 MG tablet, TAKE 1 TABLET BY MOUTH EVERYDAY AT BEDTIME, Disp: 90 tablet, Rfl: 0 .  promethazine (PHENERGAN) 25 MG tablet, TAKE 1 TABLET (25 MG TOTAL) BY MOUTH EVERY 6 (SIX) HOURS AS NEEDED., Disp: 30 tablet, Rfl: 1 .  spironolactone (ALDACTONE) 50 MG tablet, , Disp: , Rfl:  .  venlafaxine XR (EFFEXOR-XR) 37.5 MG 24 hr capsule, TAKE 1 CAPSULE BY MOUTH DAILY WITH BREAKFAST., Disp: 90 capsule, Rfl: 0  Allergies  Allergen Reactions  . Biaxin [Clarithromycin] Hives, Swelling and Nausea And Vomiting    Objective:   There were no vitals taken for this visit. AAOx3, NAD NCAT, EOMI No obvious CN deficits Coloring WNL Pt is able to speak clearly, coherently without shortness of breath or increased work of breathing.  Thought process is linear.  Mood is appropriate.   Assessment and Plan:   Depression/anxiety- anxiety has improved but depression is unchanged.  She is still in counseling regularly and she feels  that time away from work has been helpful.  Given that she still has sadness and significant depressive sxs, will increase Venlafaxine to 75mg  daily and monitor for improvement.  Pt expressed understanding and is in agreement w/ plan.    , MD 11/14/2020

## 2020-12-02 DIAGNOSIS — F4310 Post-traumatic stress disorder, unspecified: Secondary | ICD-10-CM | POA: Diagnosis not present

## 2020-12-05 ENCOUNTER — Other Ambulatory Visit: Payer: Self-pay | Admitting: Family Medicine

## 2020-12-16 DIAGNOSIS — F4310 Post-traumatic stress disorder, unspecified: Secondary | ICD-10-CM | POA: Diagnosis not present

## 2020-12-30 ENCOUNTER — Other Ambulatory Visit: Payer: Self-pay | Admitting: Family Medicine

## 2020-12-30 DIAGNOSIS — F4310 Post-traumatic stress disorder, unspecified: Secondary | ICD-10-CM | POA: Diagnosis not present

## 2021-01-06 ENCOUNTER — Other Ambulatory Visit: Payer: Self-pay

## 2021-01-06 DIAGNOSIS — F419 Anxiety disorder, unspecified: Secondary | ICD-10-CM

## 2021-01-06 DIAGNOSIS — F418 Other specified anxiety disorders: Secondary | ICD-10-CM

## 2021-01-06 DIAGNOSIS — J302 Other seasonal allergic rhinitis: Secondary | ICD-10-CM

## 2021-01-06 DIAGNOSIS — G2581 Restless legs syndrome: Secondary | ICD-10-CM

## 2021-01-06 MED ORDER — PRAMIPEXOLE DIHYDROCHLORIDE 0.25 MG PO TABS: ORAL_TABLET | ORAL | 0 refills | Status: DC

## 2021-01-06 MED ORDER — BUPROPION HCL ER (XL) 150 MG PO TB24: 1.0000 | ORAL_TABLET | Freq: Every day | ORAL | 1 refills | Status: DC

## 2021-01-06 MED ORDER — MONTELUKAST SODIUM 10 MG PO TABS: ORAL_TABLET | ORAL | 1 refills | Status: DC

## 2021-01-13 DIAGNOSIS — F4310 Post-traumatic stress disorder, unspecified: Secondary | ICD-10-CM | POA: Diagnosis not present

## 2021-02-10 DIAGNOSIS — F4310 Post-traumatic stress disorder, unspecified: Secondary | ICD-10-CM | POA: Diagnosis not present

## 2021-02-24 DIAGNOSIS — F4310 Post-traumatic stress disorder, unspecified: Secondary | ICD-10-CM | POA: Diagnosis not present

## 2021-02-26 ENCOUNTER — Encounter: Payer: Self-pay | Admitting: *Deleted

## 2021-03-03 ENCOUNTER — Other Ambulatory Visit: Payer: Self-pay | Admitting: Family Medicine

## 2021-03-03 DIAGNOSIS — G2581 Restless legs syndrome: Secondary | ICD-10-CM

## 2021-03-03 DIAGNOSIS — J302 Other seasonal allergic rhinitis: Secondary | ICD-10-CM

## 2021-03-10 DIAGNOSIS — F4310 Post-traumatic stress disorder, unspecified: Secondary | ICD-10-CM | POA: Diagnosis not present

## 2021-03-24 DIAGNOSIS — F4310 Post-traumatic stress disorder, unspecified: Secondary | ICD-10-CM | POA: Diagnosis not present

## 2021-04-14 DIAGNOSIS — F4310 Post-traumatic stress disorder, unspecified: Secondary | ICD-10-CM | POA: Diagnosis not present

## 2021-04-28 DIAGNOSIS — F4310 Post-traumatic stress disorder, unspecified: Secondary | ICD-10-CM | POA: Diagnosis not present

## 2021-05-12 DIAGNOSIS — F4310 Post-traumatic stress disorder, unspecified: Secondary | ICD-10-CM | POA: Diagnosis not present

## 2021-05-26 DIAGNOSIS — F4310 Post-traumatic stress disorder, unspecified: Secondary | ICD-10-CM | POA: Diagnosis not present

## 2021-06-23 DIAGNOSIS — F4310 Post-traumatic stress disorder, unspecified: Secondary | ICD-10-CM | POA: Diagnosis not present

## 2021-07-07 DIAGNOSIS — F4310 Post-traumatic stress disorder, unspecified: Secondary | ICD-10-CM | POA: Diagnosis not present

## 2021-07-10 ENCOUNTER — Ambulatory Visit (INDEPENDENT_AMBULATORY_CARE_PROVIDER_SITE_OTHER): Payer: BC Managed Care – PPO | Admitting: Family Medicine

## 2021-07-10 ENCOUNTER — Encounter: Payer: Self-pay | Admitting: Family Medicine

## 2021-07-10 ENCOUNTER — Other Ambulatory Visit: Payer: Self-pay | Admitting: Family Medicine

## 2021-07-10 VITALS — BP 108/72 | HR 69 | Temp 97.9°F | Resp 16 | Ht 63.5 in | Wt 195.8 lb

## 2021-07-10 DIAGNOSIS — Z0000 Encounter for general adult medical examination without abnormal findings: Secondary | ICD-10-CM | POA: Diagnosis not present

## 2021-07-10 DIAGNOSIS — G2581 Restless legs syndrome: Secondary | ICD-10-CM | POA: Diagnosis not present

## 2021-07-10 DIAGNOSIS — Z1159 Encounter for screening for other viral diseases: Secondary | ICD-10-CM

## 2021-07-10 DIAGNOSIS — Z114 Encounter for screening for human immunodeficiency virus [HIV]: Secondary | ICD-10-CM

## 2021-07-10 DIAGNOSIS — E669 Obesity, unspecified: Secondary | ICD-10-CM | POA: Diagnosis not present

## 2021-07-10 LAB — CBC WITH DIFFERENTIAL/PLATELET
Basophils Absolute: 0 10*3/uL (ref 0.0–0.1)
Basophils Relative: 0.4 % (ref 0.0–3.0)
Eosinophils Absolute: 0.1 10*3/uL (ref 0.0–0.7)
Eosinophils Relative: 0.8 % (ref 0.0–5.0)
HCT: 39.7 % (ref 36.0–46.0)
Hemoglobin: 13.3 g/dL (ref 12.0–15.0)
Lymphocytes Relative: 23.7 % (ref 12.0–46.0)
Lymphs Abs: 1.9 10*3/uL (ref 0.7–4.0)
MCHC: 33.5 g/dL (ref 30.0–36.0)
MCV: 90.1 fl (ref 78.0–100.0)
Monocytes Absolute: 0.5 10*3/uL (ref 0.1–1.0)
Monocytes Relative: 6.8 % (ref 3.0–12.0)
Neutro Abs: 5.5 10*3/uL (ref 1.4–7.7)
Neutrophils Relative %: 68.3 % (ref 43.0–77.0)
Platelets: 304 10*3/uL (ref 150.0–400.0)
RBC: 4.4 Mil/uL (ref 3.87–5.11)
RDW: 13.6 % (ref 11.5–15.5)
WBC: 8.1 10*3/uL (ref 4.0–10.5)

## 2021-07-10 LAB — BASIC METABOLIC PANEL
BUN: 11 mg/dL (ref 6–23)
CO2: 26 mEq/L (ref 19–32)
Calcium: 9.5 mg/dL (ref 8.4–10.5)
Chloride: 101 mEq/L (ref 96–112)
Creatinine, Ser: 0.89 mg/dL (ref 0.40–1.20)
GFR: 86.33 mL/min (ref >60.00)
Glucose, Bld: 78 mg/dL (ref 70–99)
Potassium: 4.3 mEq/L (ref 3.5–5.1)
Sodium: 135 mEq/L (ref 135–145)

## 2021-07-10 LAB — HEPATIC FUNCTION PANEL
ALT: 14 U/L (ref 0–35)
AST: 15 U/L (ref 0–37)
Albumin: 4.6 g/dL (ref 3.5–5.2)
Alkaline Phosphatase: 66 U/L (ref 39–117)
Bilirubin, Direct: 0.1 mg/dL (ref 0.0–0.3)
Total Bilirubin: 0.4 mg/dL (ref 0.2–1.2)
Total Protein: 7.4 g/dL (ref 6.0–8.3)

## 2021-07-10 LAB — LIPID PANEL
Cholesterol: 193 mg/dL (ref 0–200)
HDL: 44.3 mg/dL (ref >39.00)
LDL Cholesterol: 132 mg/dL — ABNORMAL HIGH (ref 0–99)
NonHDL: 148.43
Total CHOL/HDL Ratio: 4
Triglycerides: 84 mg/dL (ref 0.0–149.0)
VLDL: 16.8 mg/dL (ref 0.0–40.0)

## 2021-07-10 LAB — B12 AND FOLATE PANEL
Folate: 9.8 ng/mL (ref >5.9)
Vitamin B-12: 250 pg/mL (ref 211–911)

## 2021-07-10 LAB — TSH: TSH: 3.1 u[IU]/mL (ref 0.35–5.50)

## 2021-07-10 LAB — VITAMIN D 25 HYDROXY (VIT D DEFICIENCY, FRACTURES): VITD: 32.27 ng/mL (ref 30.00–100.00)

## 2021-07-10 MED ORDER — PRAMIPEXOLE DIHYDROCHLORIDE 0.5 MG PO TABS: 0.5000 mg | ORAL_TABLET | Freq: Every day | ORAL | 1 refills | Status: DC

## 2021-07-10 NOTE — Patient Instructions (Signed)
Follow up in 1 year or as needed We'll notify you of your lab results and make any changes if needed Continue to work on healthy diet and regular exercise- you can do it! INCREASE the Mirapex to 0.5mg  nightly- 2 of what you have at home and 1 of the new prescription Call with any questions or concerns Stay Safe!  Stay Healthy! Happy Holidays!!

## 2021-07-10 NOTE — Assessment & Plan Note (Signed)
Pt's PE WNL w/ exception of obesity.  UTD on Tdap, flu, COVID.  Will defer pap today as pt was not aware she needed one.  Check labs.  Anticipatory guidance provided.

## 2021-07-10 NOTE — Assessment & Plan Note (Signed)
Deteriorated.  Pt has gained 17 lbs since last visit.  Stressed need for healthy diet and regular exercise.  Check labs to risk stratify.  Will follow. 

## 2021-07-10 NOTE — Assessment & Plan Note (Signed)
Deteriorated.  Pt had been well controlled but just recently she notes increased symptoms w/ difficulty both falling asleep and staying asleep.  Increase Mirapex to 0.5mg  nightly and monitor for improvement.  Pt expressed understanding and is in agreement w/ plan.

## 2021-07-10 NOTE — Progress Notes (Signed)
   Subjective:    Patient ID: Stacie Rosario, female    DOB: 1991-05-29, 30 y.o.   MRN: [id number]  HPI CPE- due for pap.  UTD on Tdap, flu.  Pt had a job change that she really enjoys.  Continues w/ therapy.  Health Maintenance  Topic Date Due   Pneumococcal Vaccine 7-6 Years old (1 - PCV) Never done   HIV Screening  Never done   Hepatitis C Screening  Never done   PAP SMEAR-Modifier  01/21/2021   TETANUS/TDAP  01/22/2024   INFLUENZA VACCINE  Completed   COVID-19 Vaccine  Completed   HPV VACCINES  Aged Out      Review of Systems Patient reports no vision/ hearing changes, adenopathy,fever,  persistant/recurrent hoarseness , swallowing issues, chest pain, palpitations, edema, persistant/recurrent cough, hemoptysis, dyspnea (rest/exertional/paroxysmal nocturnal), gastrointestinal bleeding (melena, rectal bleeding), abdominal pain, significant heartburn, bowel changes, GU symptoms (dysuria, hematuria, incontinence), Gyn symptoms (abnormal  bleeding, pain),  syncope, focal weakness, memory loss, numbness & tingling, skin/hair/nail changes, abnormal bruising or bleeding, anxiety, or depression.   + 17 lb weight gain + RLS- previously well controlled on Mirapex but this has worsened in the last month.  No change in weight or activity level.  Difficulty falling asleep and has woken her the last 2 nights.  This visit occurred during the SARS-CoV-2 public health emergency.  Safety protocols were in place, including screening questions prior to the visit, additional usage of staff PPE, and extensive cleaning of exam room while observing appropriate contact time as indicated for disinfecting solutions.      Objective:   Physical Exam General Appearance:    Alert, cooperative, no distress, appears stated age, obese  Head:    Normocephalic, without obvious abnormality, atraumatic  Eyes:    PERRL, conjunctiva/corneas clear, EOM's intact, fundi    benign, both eyes  Ears:    Normal TM's and  external ear canals, both ears  Nose:   Deferred due to COVID  Throat:   Neck:   Supple, symmetrical, trachea midline, no adenopathy;    Thyroid: no enlargement/tenderness/nodules  Back:     Symmetric, no curvature, ROM normal, no CVA tenderness  Lungs:     Clear to auscultation bilaterally, respirations unlabored  Chest Wall:    No tenderness or deformity   Heart:    Regular rate and rhythm, S1 and S2 normal, no murmur, rub   or gallop  Breast Exam:    Deferred to GYN  Abdomen:     Soft, non-tender, bowel sounds active all four quadrants,    no masses, no organomegaly  Genitalia:    Deferred to GYN  Rectal:    Extremities:   Extremities normal, atraumatic, no cyanosis or edema  Pulses:   2+ and symmetric all extremities  Skin:   Skin color, texture, turgor normal, no rashes or lesions  Lymph nodes:   Cervical, supraclavicular, and axillary nodes normal  Neurologic:   CNII-XII intact, normal strength, sensation and reflexes    throughout          Assessment & Plan:

## 2021-07-11 LAB — HEPATITIS C ANTIBODY
Hepatitis C Ab: NONREACTIVE
SIGNAL TO CUT-OFF: 0.08 (ref <1.00)

## 2021-07-11 LAB — HIV ANTIBODY (ROUTINE TESTING W REFLEX): HIV 1&2 Ab, 4th Generation: NONREACTIVE

## 2021-07-21 DIAGNOSIS — F4310 Post-traumatic stress disorder, unspecified: Secondary | ICD-10-CM | POA: Diagnosis not present

## 2021-08-04 DIAGNOSIS — F4310 Post-traumatic stress disorder, unspecified: Secondary | ICD-10-CM | POA: Diagnosis not present

## 2021-08-18 ENCOUNTER — Other Ambulatory Visit: Payer: Self-pay | Admitting: Family Medicine

## 2021-08-18 DIAGNOSIS — J302 Other seasonal allergic rhinitis: Secondary | ICD-10-CM

## 2021-08-18 DIAGNOSIS — F4310 Post-traumatic stress disorder, unspecified: Secondary | ICD-10-CM | POA: Diagnosis not present

## 2021-09-01 DIAGNOSIS — F4310 Post-traumatic stress disorder, unspecified: Secondary | ICD-10-CM | POA: Diagnosis not present

## 2021-09-15 DIAGNOSIS — F4310 Post-traumatic stress disorder, unspecified: Secondary | ICD-10-CM | POA: Diagnosis not present

## 2021-10-06 ENCOUNTER — Other Ambulatory Visit: Payer: Self-pay | Admitting: Family Medicine

## 2021-10-06 DIAGNOSIS — F418 Other specified anxiety disorders: Secondary | ICD-10-CM

## 2021-10-13 DIAGNOSIS — F4310 Post-traumatic stress disorder, unspecified: Secondary | ICD-10-CM | POA: Diagnosis not present

## 2021-10-27 DIAGNOSIS — F4310 Post-traumatic stress disorder, unspecified: Secondary | ICD-10-CM | POA: Diagnosis not present

## 2021-11-05 DIAGNOSIS — L700 Acne vulgaris: Secondary | ICD-10-CM | POA: Diagnosis not present

## 2021-11-10 DIAGNOSIS — F4310 Post-traumatic stress disorder, unspecified: Secondary | ICD-10-CM | POA: Diagnosis not present

## 2021-11-24 DIAGNOSIS — F4310 Post-traumatic stress disorder, unspecified: Secondary | ICD-10-CM | POA: Diagnosis not present

## 2021-12-08 DIAGNOSIS — F4310 Post-traumatic stress disorder, unspecified: Secondary | ICD-10-CM | POA: Diagnosis not present

## 2021-12-19 ENCOUNTER — Other Ambulatory Visit: Payer: Self-pay | Admitting: Family Medicine

## 2022-01-05 DIAGNOSIS — F4310 Post-traumatic stress disorder, unspecified: Secondary | ICD-10-CM | POA: Diagnosis not present

## 2022-01-19 DIAGNOSIS — F4310 Post-traumatic stress disorder, unspecified: Secondary | ICD-10-CM | POA: Diagnosis not present

## 2022-02-16 DIAGNOSIS — F4310 Post-traumatic stress disorder, unspecified: Secondary | ICD-10-CM | POA: Diagnosis not present

## 2022-03-05 DIAGNOSIS — F4310 Post-traumatic stress disorder, unspecified: Secondary | ICD-10-CM | POA: Diagnosis not present

## 2022-03-16 DIAGNOSIS — F4310 Post-traumatic stress disorder, unspecified: Secondary | ICD-10-CM | POA: Diagnosis not present

## 2022-03-30 DIAGNOSIS — F4310 Post-traumatic stress disorder, unspecified: Secondary | ICD-10-CM | POA: Diagnosis not present

## 2022-04-02 DIAGNOSIS — R635 Abnormal weight gain: Secondary | ICD-10-CM | POA: Diagnosis not present

## 2022-04-02 DIAGNOSIS — Z131 Encounter for screening for diabetes mellitus: Secondary | ICD-10-CM | POA: Diagnosis not present

## 2022-04-02 DIAGNOSIS — E782 Mixed hyperlipidemia: Secondary | ICD-10-CM | POA: Diagnosis not present

## 2022-04-02 DIAGNOSIS — Z6835 Body mass index (BMI) 35.0-35.9, adult: Secondary | ICD-10-CM | POA: Diagnosis not present

## 2022-04-02 DIAGNOSIS — N951 Menopausal and female climacteric states: Secondary | ICD-10-CM | POA: Diagnosis not present

## 2022-04-02 DIAGNOSIS — Z1329 Encounter for screening for other suspected endocrine disorder: Secondary | ICD-10-CM | POA: Diagnosis not present

## 2022-04-07 DIAGNOSIS — E782 Mixed hyperlipidemia: Secondary | ICD-10-CM | POA: Diagnosis not present

## 2022-04-07 DIAGNOSIS — Z1331 Encounter for screening for depression: Secondary | ICD-10-CM | POA: Diagnosis not present

## 2022-04-07 DIAGNOSIS — Z1339 Encounter for screening examination for other mental health and behavioral disorders: Secondary | ICD-10-CM | POA: Diagnosis not present

## 2022-04-07 DIAGNOSIS — N951 Menopausal and female climacteric states: Secondary | ICD-10-CM | POA: Diagnosis not present

## 2022-04-07 DIAGNOSIS — Z6835 Body mass index (BMI) 35.0-35.9, adult: Secondary | ICD-10-CM | POA: Diagnosis not present

## 2022-04-16 DIAGNOSIS — E782 Mixed hyperlipidemia: Secondary | ICD-10-CM | POA: Diagnosis not present

## 2022-04-16 DIAGNOSIS — Z6835 Body mass index (BMI) 35.0-35.9, adult: Secondary | ICD-10-CM | POA: Diagnosis not present

## 2022-04-17 DIAGNOSIS — F419 Anxiety disorder, unspecified: Secondary | ICD-10-CM | POA: Diagnosis not present

## 2022-04-22 DIAGNOSIS — E663 Overweight: Secondary | ICD-10-CM | POA: Diagnosis not present

## 2022-04-22 DIAGNOSIS — Z1329 Encounter for screening for other suspected endocrine disorder: Secondary | ICD-10-CM | POA: Diagnosis not present

## 2022-04-23 ENCOUNTER — Encounter: Payer: Self-pay | Admitting: Family Medicine

## 2022-04-23 ENCOUNTER — Ambulatory Visit: Payer: BC Managed Care – PPO | Admitting: Family Medicine

## 2022-04-23 VITALS — BP 112/78 | HR 94 | Temp 97.8°F | Resp 16 | Ht 63.5 in | Wt 193.4 lb

## 2022-04-23 DIAGNOSIS — J029 Acute pharyngitis, unspecified: Secondary | ICD-10-CM | POA: Diagnosis not present

## 2022-04-23 LAB — POCT RAPID STREP A (OFFICE): Rapid Strep A Screen: NEGATIVE

## 2022-04-23 NOTE — Patient Instructions (Signed)
Follow up as needed or as scheduled Finish the Amoxicillin as directed Drink LOTS of fluids Tylenol or ibuprofen for pain REST! Call with any questions or concerns Hang in there!!!

## 2022-04-23 NOTE — Progress Notes (Signed)
   Subjective:    Patient ID: Stacie Rosario, female    DOB: Mar 11, 1991, 31 y.o.   MRN: [id number]  HPI Sore throat- sxs started Monday w/ R sided sore throat.  Tuesday night throat looked swollen and red.  Yesterday felt worse- painful to swallow.  Went to UC yesterday and dx'd w/ 'severe tonsillitis' and prescribed Amox.  Pt has had 3 doses of Amox and has had some improvement.  No fevers.  No body aches, HA.  Mild cough.     Review of Systems For ROS see HPI     Objective:   Physical Exam Vitals reviewed.  Constitutional:      General: She is not in acute distress.    Appearance: She is well-developed. She is not ill-appearing.  HENT:     Head: Normocephalic and atraumatic.     Right Ear: Tympanic membrane and ear canal normal.     Left Ear: Tympanic membrane and ear canal normal.     Nose: Mucosal edema and rhinorrhea present.     Right Sinus: No maxillary sinus tenderness or frontal sinus tenderness.     Left Sinus: No maxillary sinus tenderness or frontal sinus tenderness.     Mouth/Throat:     Mouth: Mucous membranes are moist.     Pharynx: Posterior oropharyngeal erythema (w/ PND) present. No uvula swelling.     Tonsils: Tonsillar exudate present. No tonsillar abscesses. 1+ on the right. 1+ on the left.  Eyes:     Conjunctiva/sclera: Conjunctivae normal.     Pupils: Pupils are equal, round, and reactive to light.  Cardiovascular:     Rate and Rhythm: Normal rate and regular rhythm.     Heart sounds: Normal heart sounds.  Pulmonary:     Effort: Pulmonary effort is normal. No respiratory distress.     Breath sounds: Normal breath sounds. No wheezing or rales.  Musculoskeletal:     Cervical back: Normal range of motion and neck supple.  Lymphadenopathy:     Cervical: No cervical adenopathy.  Skin:    General: Skin is warm and dry.  Neurological:     General: No focal deficit present.     Mental Status: She is alert and oriented to person, place, and time.   Psychiatric:        Mood and Affect: Mood normal.        Behavior: Behavior normal.        Thought Content: Thought content normal.           Assessment & Plan:   Sore throat- given that she has had 2 negative strep tests (1 at [facility] and one here today) it is most likely to be viral.  But since she is having some relief from Amox and already started the course of abx, encouraged her to finish this round of medication.  Reviewed supportive care and red flags that should prompt return.  Pt expressed understanding and is in agreement w/ plan.

## 2022-04-24 ENCOUNTER — Ambulatory Visit: Payer: BC Managed Care – PPO | Admitting: Family Medicine

## 2022-04-27 DIAGNOSIS — F4310 Post-traumatic stress disorder, unspecified: Secondary | ICD-10-CM | POA: Diagnosis not present

## 2022-04-29 DIAGNOSIS — Z6834 Body mass index (BMI) 34.0-34.9, adult: Secondary | ICD-10-CM | POA: Diagnosis not present

## 2022-04-29 DIAGNOSIS — E782 Mixed hyperlipidemia: Secondary | ICD-10-CM | POA: Diagnosis not present

## 2022-05-01 DIAGNOSIS — F419 Anxiety disorder, unspecified: Secondary | ICD-10-CM | POA: Diagnosis not present

## 2022-05-06 DIAGNOSIS — E782 Mixed hyperlipidemia: Secondary | ICD-10-CM | POA: Diagnosis not present

## 2022-05-06 DIAGNOSIS — Z6834 Body mass index (BMI) 34.0-34.9, adult: Secondary | ICD-10-CM | POA: Diagnosis not present

## 2022-05-11 DIAGNOSIS — F4310 Post-traumatic stress disorder, unspecified: Secondary | ICD-10-CM | POA: Diagnosis not present

## 2022-05-13 DIAGNOSIS — E663 Overweight: Secondary | ICD-10-CM | POA: Diagnosis not present

## 2022-05-15 DIAGNOSIS — F419 Anxiety disorder, unspecified: Secondary | ICD-10-CM | POA: Diagnosis not present

## 2022-05-26 DIAGNOSIS — E782 Mixed hyperlipidemia: Secondary | ICD-10-CM | POA: Diagnosis not present

## 2022-05-26 DIAGNOSIS — Z6834 Body mass index (BMI) 34.0-34.9, adult: Secondary | ICD-10-CM | POA: Diagnosis not present

## 2022-05-29 DIAGNOSIS — F419 Anxiety disorder, unspecified: Secondary | ICD-10-CM | POA: Diagnosis not present

## 2022-06-08 DIAGNOSIS — E669 Obesity, unspecified: Secondary | ICD-10-CM | POA: Diagnosis not present

## 2022-06-08 DIAGNOSIS — Z131 Encounter for screening for diabetes mellitus: Secondary | ICD-10-CM | POA: Diagnosis not present

## 2022-06-15 DIAGNOSIS — Z6834 Body mass index (BMI) 34.0-34.9, adult: Secondary | ICD-10-CM | POA: Diagnosis not present

## 2022-06-15 DIAGNOSIS — E782 Mixed hyperlipidemia: Secondary | ICD-10-CM | POA: Diagnosis not present

## 2022-06-19 DIAGNOSIS — F419 Anxiety disorder, unspecified: Secondary | ICD-10-CM | POA: Diagnosis not present

## 2022-06-22 DIAGNOSIS — E663 Overweight: Secondary | ICD-10-CM | POA: Diagnosis not present

## 2022-06-22 DIAGNOSIS — Z131 Encounter for screening for diabetes mellitus: Secondary | ICD-10-CM | POA: Diagnosis not present

## 2022-06-22 DIAGNOSIS — F4310 Post-traumatic stress disorder, unspecified: Secondary | ICD-10-CM | POA: Diagnosis not present

## 2022-06-24 ENCOUNTER — Encounter: Payer: Self-pay | Admitting: Family Medicine

## 2022-06-24 ENCOUNTER — Ambulatory Visit (INDEPENDENT_AMBULATORY_CARE_PROVIDER_SITE_OTHER): Payer: BC Managed Care – PPO | Admitting: Family Medicine

## 2022-06-24 ENCOUNTER — Other Ambulatory Visit (HOSPITAL_COMMUNITY)
Admission: RE | Admit: 2022-06-24 | Discharge: 2022-06-24 | Disposition: A | Discharge status: Home / Self Care | Payer: BC Managed Care – PPO | Source: Ambulatory Visit | Attending: Family Medicine | Admitting: Family Medicine

## 2022-06-24 VITALS — BP 122/76 | HR 90 | Temp 97.6°F | Resp 17 | Ht 63.5 in | Wt 192.1 lb

## 2022-06-24 DIAGNOSIS — Z124 Encounter for screening for malignant neoplasm of cervix: Secondary | ICD-10-CM | POA: Insufficient documentation

## 2022-06-24 DIAGNOSIS — E669 Obesity, unspecified: Secondary | ICD-10-CM | POA: Diagnosis not present

## 2022-06-24 DIAGNOSIS — Z0000 Encounter for general adult medical examination without abnormal findings: Secondary | ICD-10-CM

## 2022-06-24 LAB — HEPATIC FUNCTION PANEL
ALT: 15 U/L (ref 0–35)
AST: 14 U/L (ref 0–37)
Albumin: 4.6 g/dL (ref 3.5–5.2)
Alkaline Phosphatase: 57 U/L (ref 39–117)
Bilirubin, Direct: 0.1 mg/dL (ref 0.0–0.3)
Total Bilirubin: 0.3 mg/dL (ref 0.2–1.2)
Total Protein: 8.1 g/dL (ref 6.0–8.3)

## 2022-06-24 LAB — CBC WITH DIFFERENTIAL/PLATELET
Basophils Absolute: 0 10*3/uL (ref 0.0–0.1)
Basophils Relative: 0.3 % (ref 0.0–3.0)
Eosinophils Absolute: 0.1 10*3/uL (ref 0.0–0.7)
Eosinophils Relative: 0.9 % (ref 0.0–5.0)
HCT: 40.2 % (ref 36.0–46.0)
Hemoglobin: 13.6 g/dL (ref 12.0–15.0)
Lymphocytes Relative: 21.6 % (ref 12.0–46.0)
Lymphs Abs: 1.7 10*3/uL (ref 0.7–4.0)
MCHC: 33.8 g/dL (ref 30.0–36.0)
MCV: 89.8 fl (ref 78.0–100.0)
Monocytes Absolute: 0.4 10*3/uL (ref 0.1–1.0)
Monocytes Relative: 5.5 % (ref 3.0–12.0)
Neutro Abs: 5.8 10*3/uL (ref 1.4–7.7)
Neutrophils Relative %: 71.7 % (ref 43.0–77.0)
Platelets: 315 10*3/uL (ref 150.0–400.0)
RBC: 4.48 Mil/uL (ref 3.87–5.11)
RDW: 14.2 % (ref 11.5–15.5)
WBC: 8.1 10*3/uL (ref 4.0–10.5)

## 2022-06-24 LAB — BASIC METABOLIC PANEL
BUN: 17 mg/dL (ref 6–23)
CO2: 29 mEq/L (ref 19–32)
Calcium: 9.9 mg/dL (ref 8.4–10.5)
Chloride: 100 mEq/L (ref 96–112)
Creatinine, Ser: 0.94 mg/dL (ref 0.40–1.20)
GFR: 80.3 mL/min (ref >60.00)
Glucose, Bld: 87 mg/dL (ref 70–99)
Potassium: 3.9 mEq/L (ref 3.5–5.1)
Sodium: 136 mEq/L (ref 135–145)

## 2022-06-24 LAB — LIPID PANEL
Cholesterol: 176 mg/dL (ref 0–200)
HDL: 46.5 mg/dL (ref >39.00)
LDL Cholesterol: 115 mg/dL — ABNORMAL HIGH (ref 0–99)
NonHDL: 129.28
Total CHOL/HDL Ratio: 4
Triglycerides: 72 mg/dL (ref 0.0–149.0)
VLDL: 14.4 mg/dL (ref 0.0–40.0)

## 2022-06-24 LAB — VITAMIN D 25 HYDROXY (VIT D DEFICIENCY, FRACTURES): VITD: 51.38 ng/mL (ref 30.00–100.00)

## 2022-06-24 LAB — TSH: TSH: 1.71 u[IU]/mL (ref 0.35–5.50)

## 2022-06-24 MED ORDER — ALPRAZOLAM 0.25 MG PO TABS: 0.2500 mg | ORAL_TABLET | Freq: Two times a day (BID) | ORAL | 1 refills | Status: DC | PRN

## 2022-06-24 MED ORDER — PROMETHAZINE HCL 25 MG PO TABS: 25.0000 mg | ORAL_TABLET | Freq: Four times a day (QID) | ORAL | 1 refills | Status: DC | PRN

## 2022-06-24 NOTE — Assessment & Plan Note (Signed)
Pap collected. 

## 2022-06-24 NOTE — Assessment & Plan Note (Signed)
Ongoing issue for pt.  Stressed need for healthy diet and regular exercise.  Check labs to risk stratify.  Will follow 

## 2022-06-24 NOTE — Progress Notes (Signed)
   Subjective:    Patient ID: Stacie Rosario, female    DOB: 1990-10-03, 31 y.o.   MRN: [id number]  HPI CPE-UTD on Tdap, flu.  Due for pap.  No concerns today  Health Maintenance  Topic Date Due   PAP SMEAR-Modifier  07/10/2022 (Originally 01/21/2021)   TETANUS/TDAP  01/22/2024   INFLUENZA VACCINE  Completed   COVID-19 Vaccine  Completed   Hepatitis C Screening  Completed   HIV Screening  Completed   HPV VACCINES  Aged Out      Review of Systems Patient reports no vision/ hearing changes, adenopathy,fever, weight change,  persistant/recurrent hoarseness , swallowing issues, chest pain, palpitations, edema, persistant/recurrent cough, hemoptysis, dyspnea (rest/exertional/paroxysmal nocturnal), gastrointestinal bleeding (melena, rectal bleeding), abdominal pain, significant heartburn, bowel changes, GU symptoms (dysuria, hematuria, incontinence), Gyn symptoms (abnormal  bleeding, pain),  syncope, focal weakness, memory loss, numbness & tingling, skin/hair/nail changes, abnormal bruising or bleeding, anxiety, or depression.     Objective:   Physical Exam  General Appearance:    Alert, cooperative, no distress, appears stated age  Head:    Normocephalic, without obvious abnormality, atraumatic  Eyes:    PERRL, conjunctiva/corneas clear, EOM's intact both eyes  Ears:    Normal TM's and external ear canals, both ears  Nose:   Nares normal, septum midline, mucosa normal, no drainage    or sinus tenderness  Throat:   Lips, mucosa, and tongue normal; teeth and gums normal  Neck:   Supple, symmetrical, trachea midline, no adenopathy;    Thyroid: no enlargement/tenderness/nodules  Back:     Symmetric, no curvature, ROM normal, no CVA tenderness  Lungs:     Clear to auscultation bilaterally, respirations unlabored  Chest Wall:    No tenderness or deformity   Heart:    Regular rate and rhythm, S1 and S2 normal, no murmur, rub   or gallop  Breast Exam:    No tenderness, masses, or nipple  abnormality  Abdomen:     Soft, non-tender, bowel sounds active all four quadrants,    no masses, no organomegaly  Genitalia:    External genitalia normal, cervix normal in appearance, no CMT, uterus in normal size and position, adnexa w/out mass or tenderness, mucosa pink and moist, no lesions or discharge present  Rectal:    Normal external appearance  Extremities:   Extremities normal, atraumatic, no cyanosis or edema  Pulses:   2+ and symmetric all extremities  Skin:   Skin color, texture, turgor normal, no rashes or lesions  Lymph nodes:   Cervical, supraclavicular, and axillary nodes normal  Neurologic:   CNII-XII intact, normal strength, sensation and reflexes    throughout          Assessment & Plan:

## 2022-06-24 NOTE — Assessment & Plan Note (Signed)
Pt's PE WNL w/ exception of BMI.  UTD on Tdap, flu.  Pap collected.  Check labs.  Anticipatory guidance provided.

## 2022-06-24 NOTE — Patient Instructions (Signed)
Follow up in 1 year or as needed We'll notify you of your lab results and make any changes if needed Continue to work on healthy diet and regular exercise- you can do it! Call with any questions or concerns Stay Safe!  Stay Healthy! Happy Fall!! 

## 2022-06-25 NOTE — Progress Notes (Signed)
Pt seen results via my chart  

## 2022-06-26 DIAGNOSIS — F419 Anxiety disorder, unspecified: Secondary | ICD-10-CM | POA: Diagnosis not present

## 2022-06-26 LAB — CYTOLOGY - PAP
Comment: NEGATIVE
Diagnosis: NEGATIVE
High risk HPV: NEGATIVE

## 2022-07-06 ENCOUNTER — Telehealth: Payer: Self-pay

## 2022-07-06 ENCOUNTER — Encounter: Payer: Self-pay | Admitting: Family Medicine

## 2022-07-06 DIAGNOSIS — F4310 Post-traumatic stress disorder, unspecified: Secondary | ICD-10-CM | POA: Diagnosis not present

## 2022-07-06 NOTE — Telephone Encounter (Signed)
Pt was seen on Oct 25,2023 and I do not recall getting her form back for Gastric surgery  I will look in Dr Birdie Riddle to be signed folder

## 2022-07-06 NOTE — Telephone Encounter (Signed)
Form completed and given to Diamond 

## 2022-07-06 NOTE — Telephone Encounter (Signed)
Form faxed and placed in scan pt is aware

## 2022-07-10 DIAGNOSIS — F419 Anxiety disorder, unspecified: Secondary | ICD-10-CM | POA: Diagnosis not present

## 2022-07-22 DIAGNOSIS — F419 Anxiety disorder, unspecified: Secondary | ICD-10-CM | POA: Diagnosis not present

## 2022-07-30 ENCOUNTER — Encounter: Payer: Self-pay | Admitting: *Deleted

## 2022-07-30 ENCOUNTER — Telehealth: Payer: Self-pay | Admitting: *Deleted

## 2022-07-30 NOTE — Patient Outreach (Signed)
  Care Coordination   Initial Visit Note   07/30/2022 Name: Stacie Rosario MRN: [id number] DOB: Jan 23, 1991  Stacie Rosario is a 31 y.o. year old female who sees Tabori, Helane Rima, MD for primary care. I spoke with  Catalina Antigua by phone today.  What matters to the patients health and wellness today?  No needs    Goals Addressed               This Visit's Progress     COMPLETED: No needs (pt-stated)        Care Coordination Interventions: Reviewed medications with patient and discussed adherence with no needed refills Reviewed scheduled/upcoming provider appointments including sufficient transportation to all medical appointments Screening for signs and symptoms of depression related to chronic disease state  Assessed social determinant of health barriers         SDOH assessments and interventions completed:  Yes  SDOH Interventions Today    Flowsheet Row Most Recent Value  SDOH Interventions   Food Insecurity Interventions Intervention Not Indicated  Housing Interventions Intervention Not Indicated  Transportation Interventions Intervention Not Indicated  Utilities Interventions Intervention Not Indicated        Care Coordination Interventions:  Yes, provided   Follow up plan: No further intervention required.   Encounter Outcome:  Pt. Visit Completed    Elliot Cousin, RN Care Management Coordinator [facility] Main Office [phone]

## 2022-07-30 NOTE — Patient Instructions (Signed)
Visit Information  Thank you for taking time to visit with me today. Please don't hesitate to contact me if I can be of assistance to you.   Following are the goals we discussed today:   Goals Addressed               This Visit's Progress     COMPLETED: No needs (pt-stated)        Care Coordination Interventions: Reviewed medications with patient and discussed adherence with no needed refills Reviewed scheduled/upcoming provider appointments including sufficient transportation to all medical appointments Screening for signs and symptoms of depression related to chronic disease state  Assessed social determinant of health barriers         Please call the care guide team at [phone] if you need to cancel or reschedule your appointment.   If you are experiencing a Mental Health or Behavioral Health Crisis or need someone to talk to, please call the Suicide and Crisis Lifeline: 988  Patient verbalizes understanding of instructions and care plan provided today and agrees to view in MyChart. Active MyChart status and patient understanding of how to access instructions and care plan via MyChart confirmed with patient.     No further follow up required: No needs presented today  Elliot Cousin, RN Care Management Coordinator [facility] Main Office [phone]

## 2022-08-07 DIAGNOSIS — F419 Anxiety disorder, unspecified: Secondary | ICD-10-CM | POA: Diagnosis not present

## 2022-08-13 ENCOUNTER — Other Ambulatory Visit: Payer: Self-pay | Admitting: Family Medicine

## 2022-08-13 DIAGNOSIS — J302 Other seasonal allergic rhinitis: Secondary | ICD-10-CM

## 2022-08-18 DIAGNOSIS — F4310 Post-traumatic stress disorder, unspecified: Secondary | ICD-10-CM | POA: Diagnosis not present

## 2022-09-04 DIAGNOSIS — F419 Anxiety disorder, unspecified: Secondary | ICD-10-CM | POA: Diagnosis not present

## 2022-09-14 DIAGNOSIS — F4310 Post-traumatic stress disorder, unspecified: Secondary | ICD-10-CM | POA: Diagnosis not present

## 2022-09-18 DIAGNOSIS — F419 Anxiety disorder, unspecified: Secondary | ICD-10-CM | POA: Diagnosis not present

## 2022-10-01 ENCOUNTER — Other Ambulatory Visit: Payer: Self-pay | Admitting: Family Medicine

## 2022-10-01 DIAGNOSIS — F418 Other specified anxiety disorders: Secondary | ICD-10-CM

## 2022-10-12 DIAGNOSIS — F4310 Post-traumatic stress disorder, unspecified: Secondary | ICD-10-CM | POA: Diagnosis not present

## 2022-11-11 DIAGNOSIS — L700 Acne vulgaris: Secondary | ICD-10-CM | POA: Diagnosis not present

## 2022-11-23 DIAGNOSIS — F4310 Post-traumatic stress disorder, unspecified: Secondary | ICD-10-CM | POA: Diagnosis not present

## 2022-12-07 DIAGNOSIS — F4310 Post-traumatic stress disorder, unspecified: Secondary | ICD-10-CM | POA: Diagnosis not present

## 2022-12-14 ENCOUNTER — Other Ambulatory Visit: Payer: Self-pay | Admitting: Family Medicine

## 2022-12-15 ENCOUNTER — Encounter: Payer: Self-pay | Admitting: Family Medicine

## 2022-12-15 ENCOUNTER — Ambulatory Visit: Payer: BC Managed Care – PPO | Admitting: Family Medicine

## 2022-12-15 VITALS — BP 118/80 | HR 86 | Temp 98.2°F | Resp 16 | Ht 63.5 in | Wt 204.4 lb

## 2022-12-15 DIAGNOSIS — M25552 Pain in left hip: Secondary | ICD-10-CM

## 2022-12-15 DIAGNOSIS — G2581 Restless legs syndrome: Secondary | ICD-10-CM

## 2022-12-15 MED ORDER — MELOXICAM 15 MG PO TABS: 15.0000 mg | ORAL_TABLET | Freq: Every day | ORAL | 0 refills | Status: AC

## 2022-12-15 MED ORDER — PRAMIPEXOLE DIHYDROCHLORIDE 0.75 MG PO TABS: 0.7500 mg | ORAL_TABLET | Freq: Every day | ORAL | 1 refills | Status: DC

## 2022-12-15 NOTE — Progress Notes (Unsigned)
   Subjective:    Patient ID: Stacie Rosario, female    DOB: 1991-03-11, 32 y.o.   MRN: [id number]  HPI Hip pain- lateral L hip.  Worse w/ walking or standing.  Sxs have worsened and now occur w/ short standing episodes and now L foot is going numb.  No sxs when sitting.  No sxs when lying down.  Pain will radiate up slightly and down to mid thigh but rarely into lower leg.  Sxs improve w/ rest/sitting.  No relief w/ tylenol or ibuprofen.  First noticed 2-3 yrs ago.  Not TTP.  Sxs are now occurring regularly.  RLS- worsening.  Would previously take her medication around 11-11:30.  Now starting to feel sxs around 8-8:30.  Currently on Mirapex 0.5mg  QHS.   Review of Systems For ROS see HPI     Objective:   Physical Exam Vitals reviewed.  Constitutional:      General: She is not in acute distress.    Appearance: Normal appearance. She is obese. She is not ill-appearing.  HENT:     Head: Normocephalic and atraumatic.  Eyes:     Extraocular Movements: Extraocular movements intact.     Conjunctiva/sclera: Conjunctivae normal.     Pupils: Pupils are equal, round, and reactive to light.  Musculoskeletal:        General: No swelling, tenderness (no TTP over L lateral hip, glute, thigh) or deformity.  Skin:    General: Skin is warm and dry.  Neurological:     General: No focal deficit present.     Mental Status: She is alert and oriented to person, place, and time.  Psychiatric:        Mood and Affect: Mood normal.        Behavior: Behavior normal.        Thought Content: Thought content normal.           Assessment & Plan:  Lateral pain of L hip- new.  Pt reports this started as an intermittent occurrence 2-3 yrs ago but is now a constant issue w/ walking or standing.  No TTP so not consistent w/ bursitis.  Start scheduled NSAIDs and refer to PT for complete evaluation and tx.  Pt expressed understanding and is in agreement w/ plan.

## 2022-12-15 NOTE — Patient Instructions (Signed)
Schedule your complete physical for late October or early November INCREASE the Mirapex to 0.75mg  nightly START the Meloxicam once daily- take w/ food- for the next 2 weeks We'll call you to schedule your physical therapy appt Call with any questions or concerns Hang in there!!!

## 2022-12-16 NOTE — Assessment & Plan Note (Signed)
Deteriorated.  Pt reports she is having sxs starting earlier in the evening and that her medication is not controlling sxs like it was before.  Will increase Mirapex to 0.75mg  nightly and monitor for improvement.

## 2022-12-28 DIAGNOSIS — J029 Acute pharyngitis, unspecified: Secondary | ICD-10-CM | POA: Diagnosis not present

## 2022-12-30 ENCOUNTER — Ambulatory Visit: Payer: BC Managed Care – PPO | Admitting: Family Medicine

## 2022-12-30 ENCOUNTER — Encounter: Payer: Self-pay | Admitting: Family Medicine

## 2022-12-30 VITALS — BP 122/70 | HR 111 | Temp 99.6°F | Ht 63.5 in | Wt 201.6 lb

## 2022-12-30 DIAGNOSIS — R509 Fever, unspecified: Secondary | ICD-10-CM | POA: Diagnosis not present

## 2022-12-30 DIAGNOSIS — J029 Acute pharyngitis, unspecified: Secondary | ICD-10-CM

## 2022-12-30 DIAGNOSIS — B349 Viral infection, unspecified: Secondary | ICD-10-CM

## 2022-12-30 LAB — POCT RAPID STREP A (OFFICE): Rapid Strep A Screen: NEGATIVE

## 2022-12-30 LAB — POCT INFLUENZA A/B
Influenza A, POC: NEGATIVE
Influenza B, POC: NEGATIVE

## 2022-12-30 LAB — POC COVID19 BINAXNOW: SARS Coronavirus 2 Ag: NEGATIVE

## 2022-12-30 NOTE — Patient Instructions (Addendum)
Symptoms appear to be due to a virus but I will check to be rapid strep test and throat culture again today.  COVID and flu test were negative.  But she can repeat the COVID test tomorrow to be sure.  Glad to hear that she has improved.  Continue fluids, rest, Tylenol as needed for fever.  If return of vomiting, or other worsening symptoms be seen as we discussed.  Hope you continue to improve.  Please let us know if there are questions.  Viral Illness, Adult Viruses are tiny germs that can get into a person's body and cause illness. There are many different types of viruses, and they cause many types of illness. Viral illnesses can range from mild to severe. They can affect various parts of the body. Short-term conditions that are caused by a virus include colds and the flu (influenza). Long-term conditions that are caused by a virus include herpes, shingles, and HIV (human immunodeficiency virus) infection. A few viruses have been linked to certain cancers. What are the causes? Many types of viruses can cause illness. Viruses invade cells in your body, multiply, and cause the infected cells to work abnormally or die. When these cells die, they release more of the virus. When this happens, you develop symptoms of the illness, and the virus continues to spread to other cells. If the virus takes over the function of the cell, it can cause the cell to divide and grow out of control. This happens when a virus causes cancer. Different viruses get into the body in different ways. You can get a virus by: Swallowing food or water that has come in contact with the virus (is contaminated). Breathing in droplets that have been coughed or sneezed into the air by an infected person. Touching a surface that has been contaminated with the virus and then touching your eyes, nose, or mouth. Being bitten by an insect or animal that carries the virus. Having sexual contact with a person who is infected with the  virus. Being exposed to blood or fluids that contain the virus, either through an open cut or during a transfusion. If a virus enters your body, your body's defense system (immune system) will try to fight the virus. You may be at higher risk for a viral illness if your immune system is weak. What are the signs or symptoms? You may have these symptoms, depending on the type of virus and the location of the cells that it invades: Cold and flu viruses: Fever. Headache. Sore throat. Muscle aches. Stuffy nose (nasal congestion). Cough. Digestive system (gastrointestinal) viruses: Fever. Pain in the abdomen. Nausea. Diarrhea. Liver viruses (hepatitis): Loss of appetite. Tiredness. Skin or the white parts of your eyes turning yellow (jaundice). Brain and spinal cord viruses: Fever. Headache. Stiff neck. Nausea and vomiting. Confusion or sleepiness. Skin viruses: Warts. Itching. Rash. Sexually transmitted viruses: Discharge. Swelling. Redness. Rash. How is this diagnosed? This condition may be diagnosed based on one or more of the following: Symptoms. Medical history. Physical exam. Blood test, sample of mucus from your lungs (sputum sample), stool sample, or a swab of body fluids or a skin sore (lesion). How is this treated? Viruses can be hard to treat because they live within cells. Antibiotic medicines do not treat viruses because these medicines do not get inside cells. Treatment for a viral illness may include: Resting and drinking plenty of fluids. Medicines to relieve symptoms. These can include over-the-counter medicine for pain and fever, medicines for cough  or congestion, and medicines to relieve diarrhea. Antiviral medicines. These medicines are available only for certain types of viruses. Some viral illnesses can be prevented with vaccinations. A common example is the flu shot. Follow these instructions at home: Medicines Take over-the-counter and prescription  medicines only as told by your health care provider. If you were prescribed an antiviral medicine, take it as told by your health care provider. Do not stop taking the antiviral even if you start to feel better. Be aware of when antibiotics are needed and when they are not needed. Antibiotics do not treat viruses. You may get an antibiotic if your health care provider thinks that you may have, or are at risk for, a bacterial infection and you have a viral infection. Do not ask for an antibiotic prescription if you have been diagnosed with a viral illness. Antibiotics will not make your illness go away faster. Frequently taking antibiotics when they are not needed can lead to antibiotic resistance. When this develops, the medicine no longer works against the bacteria that it normally fights. General instructions  Drink enough fluids to keep your urine pale yellow. Rest as much as possible. Return to your normal activities as told by your health care provider. Ask your health care provider what activities are safe for you. Keep all follow-up visits as told by your health care provider. This is important. How is this prevented? To reduce your risk of viral illness: Wash your hands often with soap and water for at least 20 seconds. If soap and water are not available, use hand sanitizer. Avoid touching your nose, eyes, and mouth, especially if you have not washed your hands recently. If anyone in your household has a viral infection, clean all household surfaces that may have been in contact with the virus. Use soap and hot water. You may also use bleach that you have added water to (diluted). Stay away from people who are sick with symptoms of a viral infection. Do not share items such as toothbrushes and water bottles with other people. Keep your vaccinations up to date. This includes getting a yearly flu shot. Eat a healthy diet and get plenty of rest. Contact a health care provider if: You have  symptoms of a viral illness that do not go away. Your symptoms come back after going away. Your symptoms get worse. Get help right away if you have: Trouble breathing. A severe headache or a stiff neck. Severe vomiting or pain in your abdomen. These symptoms may represent a serious problem that is an emergency. Do not wait to see if the symptoms will go away. Get medical help right away. Call your local emergency services (911 in the [place] Do not drive yourself to the hospital. Summary Viruses are types of germs that can get into a person's body and cause illness. Viral illnesses can range from mild to severe. They can affect various parts of the body. Viruses can be hard to treat. There are medicines to relieve symptoms, and there are some antiviral medicines. If you were prescribed an antiviral medicine, take it as told by your health care provider. Do not stop taking the antiviral even if you start to feel better. Contact a health care provider if you have symptoms of a viral illness that do not go away. This information is not intended to replace advice given to you by your health care provider. Make sure you discuss any questions you have with your health care provider. Document Revised: 01/01/2020 Document  Reviewed: 06/27/2019 Elsevier Patient Education  2023 [employer].

## 2022-12-30 NOTE — Progress Notes (Signed)
Subjective:  Patient ID: Stacie Rosario, female    DOB: 1991-01-05  Age: 32 y.o. MRN: [id number]  CC:  Chief Complaint  Patient presents with   Fever    Started with sxs Monday afternoon, highest fever of 102.6 yesterday, body aches, congestion sinus drainage, sore throat, strep test was neg COVID neg both on Monday     HPI Stacie Rosario presents for   Fever: Started 2 days ago, body aches, congestion, sore throat, sinus drainage. Fever overnight, then Tmax 102.6 yesterday afternoon.   Initially with vomiting and diarrhea as well.  No known sick contacts. Works from home, Photographer.  No rash. No recent tick bites. No recent travel.  Tx: dayquil. Tylenol. Temp has been improving.  Feels a little better overall.  Water and gatorade yesterday, able to keep down food today. Drinking fluids.  Discolored nasal congestion clearing up today.  No abd pain. Sore to cough or deep breath in chest.    Seen at urgent care 2 days ago - negative strep test there, but home covid test negative.      History Patient Active Problem List   Diagnosis Date Noted   Obesity (BMI 30-39.9) 07/10/2021   Displacement of intrauterine contraceptive device 09/02/2018   IBS (irritable bowel syndrome) 06/06/2018   Genetic testing 11/25/2016   Family history of breast cancer in mother 10/26/2016   Weight gain 10/03/2015   Stone of salivary gland or duct 08/01/2015   Allergic rhinitis 11/24/2012   RLS (restless legs syndrome) 08/10/2012   Anxiety 05/15/2011   Physical exam 03/23/2011   Screening for malignant neoplasm of cervix 03/23/2011   HYPERLIPIDEMIA 01/24/2010   ASTHMA 12/21/2007   Asthma 12/21/2007   ACNE NEC 05/16/2007   Past Medical History:  Diagnosis Date   Acne    Allergic rhinitis    Asthma    Genetic testing 11/25/2016   Ms. Hakanson tested negative for mutations in 43 genes analyzed by [facility]'s Common Hereditary Cancer Panel. The result report is dated 11/25/2016.    No past surgical history on file. Allergies  Allergen Reactions   Biaxin [Clarithromycin] Hives, Swelling and Nausea And Vomiting   Prior to Admission medications   Medication Sig Start Date End Date Taking? Authorizing Provider  ALPRAZolam (XANAX) 0.25 MG tablet TAKE 1 TABLET BY MOUTH 3 TIMES A DAY AS NEEDED FOR ANXIETY 10/22/20  Yes Sheliah Hatch, MD  buPROPion (WELLBUTRIN XL) 150 MG 24 hr tablet TAKE 1 TABLET DAILY 10/01/22  Yes Sheliah Hatch, MD  cetirizine (ZYRTEC) 10 MG tablet Take 10 mg by mouth daily.   Yes [provider]  meloxicam (MOBIC) 15 MG tablet Take 1 tablet (15 mg total) by mouth daily. 12/15/22  Yes Sheliah Hatch, MD  montelukast (SINGULAIR) 10 MG tablet TAKE 1 TABLET DAILY AT BEDTIME 08/13/22  Yes Sheliah Hatch, MD  Multiple Vitamin (MULTIVITAMIN) tablet Take 1 tablet by mouth daily.   Yes [provider]  pramipexole (MIRAPEX) 0.75 MG tablet Take 1 tablet (0.75 mg total) by mouth at bedtime. 12/15/22  Yes Sheliah Hatch, MD  promethazine (PHENERGAN) 25 MG tablet Take 1 tablet (25 mg total) by mouth every 6 (six) hours as needed. 06/24/22  Yes Sheliah Hatch, MD  spironolactone (ALDACTONE) 50 MG tablet  12/22/19  Yes [provider]   Social History   Socioeconomic History   Marital status: Single    Spouse name: Not on file   Number of children: Not on  file   Years of education: Not on file   Highest education level: Master's degree (e.g., MA, MS, MEng, MEd, MSW, MBA)  Occupational History   Not on file  Tobacco Use   Smoking status: Never   Smokeless tobacco: Never   Tobacco comments:    Passive smoke exposure  Vaping Use   Vaping Use: Never used  Substance and Sexual Activity   Alcohol use: No   Drug use: No   Sexual activity: Not on file  Other Topics Concern   Not on file  Social History Narrative   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   Social Determinants of Health   Financial Resource  Strain: Low Risk  (12/11/2022)   Overall Financial Resource Strain (CARDIA)    Difficulty of Paying Living Expenses: Not very hard  Food Insecurity: No Food Insecurity (12/11/2022)   Hunger Vital Sign    Worried About Running Out of Food in the Last Year: Never true    Ran Out of Food in the Last Year: Never true  Transportation Needs: No Transportation Needs (12/11/2022)   PRAPARE - Administrator, Civil Service (Medical): No    Lack of Transportation (Non-Medical): No  Physical Activity: Insufficiently Active (12/11/2022)   Exercise Vital Sign    Days of Exercise per Week: 3 days    Minutes of Exercise per Session: 30 min  Stress: No Stress Concern Present (12/11/2022)   [employer] of Occupational Health - Occupational Stress Questionnaire    Feeling of Stress : Only a little  Social Connections: Socially Isolated (12/11/2022)   Social Connection and Isolation Panel [NHANES]    Frequency of Communication with Friends and Family: More than three times a week    Frequency of Social Gatherings with Friends and Family: More than three times a week    Attends Religious Services: Never    Database administrator or Organizations: No    Attends Engineer, structural: Not on file    Marital Status: Never married  Intimate Partner Violence: Not on file    Review of Systems Per HPI  Objective:   Vitals:   12/30/22 1007  BP: 122/70  Pulse: (!) 111  Temp: 99.6 F (37.6 C)  TempSrc: Oral  SpO2: 98%  Weight: 201 lb 9.6 oz (91.4 kg)  Height: 5' 3.5" (1.613 m)    Physical Exam Vitals reviewed.  Constitutional:      General: She is not in acute distress.    Appearance: She is well-developed.  HENT:     Head: Normocephalic and atraumatic.     Right Ear: Hearing, tympanic membrane, ear canal and external ear normal.     Left Ear: Hearing, tympanic membrane, ear canal and external ear normal.     Nose: Nose normal.     Mouth/Throat:     Pharynx: Posterior  oropharyngeal erythema (small amoutn tonsillar recess, no exudate. uvula midline.) present.  Eyes:     Conjunctiva/sclera: Conjunctivae normal.     Pupils: Pupils are equal, round, and reactive to light.  Cardiovascular:     Rate and Rhythm: Normal rate and regular rhythm.     Heart sounds: Normal heart sounds. No murmur heard. Pulmonary:     Effort: Pulmonary effort is normal. No respiratory distress.     Breath sounds: Normal breath sounds. No wheezing or rhonchi.  Abdominal:     General: Abdomen is flat. There is no distension.     Tenderness: There is no  abdominal tenderness.  Skin:    General: Skin is warm and dry.     Findings: No rash.  Neurological:     Mental Status: She is alert and oriented to person, place, and time.  Psychiatric:        Mood and Affect: Mood normal.        Behavior: Behavior normal.    Results for orders placed or performed in visit on 12/30/22  POC COVID-19  Result Value Ref Range   SARS Coronavirus 2 Ag Negative Negative  POCT Influenza A/B  Result Value Ref Range   Influenza A, POC Negative Negative   Influenza B, POC Negative Negative  POCT rapid strep A  Result Value Ref Range   Rapid Strep A Screen Negative Negative     Assessment & Plan:  Stacie Rosario is a 32 y.o. female . Fever, unspecified fever cause - Plan: POC COVID-19, POCT Influenza A/B, POCT rapid strep A, Culture, Group A Strep  Sore throat - Plan: POCT rapid strep A, Culture, Group A Strep  Viral syndrome Fever with cough, congestion, sore throat, initial vomiting.  Suspected viral illness.  Symptoms have improved.  Negative rapid strep reported at other office, unable to see results, erythema posterior oropharynx without exudate, repeat testing performed along with culture.  COVID and flu testing negative.  Recommended repeat COVID test tomorrow and if negative unlikely COVID infection.  Nontoxic-appearing, tolerating fluids, deferred further testing, blood work at this  time but RTC precautions given.  Symptomatic care discussed.  No orders of the defined types were placed in this encounter.  Patient Instructions  Symptoms appear to be due to a virus but I will check to be rapid strep test and throat culture again today.  COVID and flu test were negative.  But she can repeat the COVID test tomorrow to be sure.  Glad to hear that she has improved.  Continue fluids, rest, Tylenol as needed for fever.  If return of vomiting, or other worsening symptoms be seen as we discussed.  Hope you continue to improve.  Please let us know if there are questions.  Viral Illness, Adult Viruses are tiny germs that can get into a person's body and cause illness. There are many different types of viruses, and they cause many types of illness. Viral illnesses can range from mild to severe. They can affect various parts of the body. Short-term conditions that are caused by a virus include colds and the flu (influenza). Long-term conditions that are caused by a virus include herpes, shingles, and HIV (human immunodeficiency virus) infection. A few viruses have been linked to certain cancers. What are the causes? Many types of viruses can cause illness. Viruses invade cells in your body, multiply, and cause the infected cells to work abnormally or die. When these cells die, they release more of the virus. When this happens, you develop symptoms of the illness, and the virus continues to spread to other cells. If the virus takes over the function of the cell, it can cause the cell to divide and grow out of control. This happens when a virus causes cancer. Different viruses get into the body in different ways. You can get a virus by: Swallowing food or water that has come in contact with the virus (is contaminated). Breathing in droplets that have been coughed or sneezed into the air by an infected person. Touching a surface that has been contaminated with the virus and then touching your  eyes, nose, or  mouth. Being bitten by an insect or animal that carries the virus. Having sexual contact with a person who is infected with the virus. Being exposed to blood or fluids that contain the virus, either through an open cut or during a transfusion. If a virus enters your body, your body's defense system (immune system) will try to fight the virus. You may be at higher risk for a viral illness if your immune system is weak. What are the signs or symptoms? You may have these symptoms, depending on the type of virus and the location of the cells that it invades: Cold and flu viruses: Fever. Headache. Sore throat. Muscle aches. Stuffy nose (nasal congestion). Cough. Digestive system (gastrointestinal) viruses: Fever. Pain in the abdomen. Nausea. Diarrhea. Liver viruses (hepatitis): Loss of appetite. Tiredness. Skin or the white parts of your eyes turning yellow (jaundice). Brain and spinal cord viruses: Fever. Headache. Stiff neck. Nausea and vomiting. Confusion or sleepiness. Skin viruses: Warts. Itching. Rash. Sexually transmitted viruses: Discharge. Swelling. Redness. Rash. How is this diagnosed? This condition may be diagnosed based on one or more of the following: Symptoms. Medical history. Physical exam. Blood test, sample of mucus from your lungs (sputum sample), stool sample, or a swab of body fluids or a skin sore (lesion). How is this treated? Viruses can be hard to treat because they live within cells. Antibiotic medicines do not treat viruses because these medicines do not get inside cells. Treatment for a viral illness may include: Resting and drinking plenty of fluids. Medicines to relieve symptoms. These can include over-the-counter medicine for pain and fever, medicines for cough or congestion, and medicines to relieve diarrhea. Antiviral medicines. These medicines are available only for certain types of viruses. Some viral illnesses can be  prevented with vaccinations. A common example is the flu shot. Follow these instructions at home: Medicines Take over-the-counter and prescription medicines only as told by your health care provider. If you were prescribed an antiviral medicine, take it as told by your health care provider. Do not stop taking the antiviral even if you start to feel better. Be aware of when antibiotics are needed and when they are not needed. Antibiotics do not treat viruses. You may get an antibiotic if your health care provider thinks that you may have, or are at risk for, a bacterial infection and you have a viral infection. Do not ask for an antibiotic prescription if you have been diagnosed with a viral illness. Antibiotics will not make your illness go away faster. Frequently taking antibiotics when they are not needed can lead to antibiotic resistance. When this develops, the medicine no longer works against the bacteria that it normally fights. General instructions  Drink enough fluids to keep your urine pale yellow. Rest as much as possible. Return to your normal activities as told by your health care provider. Ask your health care provider what activities are safe for you. Keep all follow-up visits as told by your health care provider. This is important. How is this prevented? To reduce your risk of viral illness: Wash your hands often with soap and water for at least 20 seconds. If soap and water are not available, use hand sanitizer. Avoid touching your nose, eyes, and mouth, especially if you have not washed your hands recently. If anyone in your household has a viral infection, clean all household surfaces that may have been in contact with the virus. Use soap and hot water. You may also use bleach that you have added  water to (diluted). Stay away from people who are sick with symptoms of a viral infection. Do not share items such as toothbrushes and water bottles with other people. Keep your  vaccinations up to date. This includes getting a yearly flu shot. Eat a healthy diet and get plenty of rest. Contact a health care provider if: You have symptoms of a viral illness that do not go away. Your symptoms come back after going away. Your symptoms get worse. Get help right away if you have: Trouble breathing. A severe headache or a stiff neck. Severe vomiting or pain in your abdomen. These symptoms may represent a serious problem that is an emergency. Do not wait to see if the symptoms will go away. Get medical help right away. Call your local emergency services (911 in the [place] Do not drive yourself to the hospital. Summary Viruses are types of germs that can get into a person's body and cause illness. Viral illnesses can range from mild to severe. They can affect various parts of the body. Viruses can be hard to treat. There are medicines to relieve symptoms, and there are some antiviral medicines. If you were prescribed an antiviral medicine, take it as told by your health care provider. Do not stop taking the antiviral even if you start to feel better. Contact a health care provider if you have symptoms of a viral illness that do not go away. This information is not intended to replace advice given to you by your health care provider. Make sure you discuss any questions you have with your health care provider. Document Revised: 01/01/2020 Document Reviewed: 06/27/2019 Elsevier Patient Education  2023 Elsevier Inc.     Signed,   Meredith Staggers, MD [facility], [facility] 12/30/22 10:51 AM

## 2023-01-01 LAB — CULTURE, GROUP A STREP
MICRO NUMBER:: 14899597
SPECIMEN QUALITY:: ADEQUATE

## 2023-01-06 DIAGNOSIS — M25552 Pain in left hip: Secondary | ICD-10-CM | POA: Diagnosis not present

## 2023-01-08 DIAGNOSIS — M25552 Pain in left hip: Secondary | ICD-10-CM | POA: Diagnosis not present

## 2023-01-11 ENCOUNTER — Telehealth: Payer: Self-pay

## 2023-01-11 NOTE — Telephone Encounter (Signed)
Signed and returned to [facility] 

## 2023-01-11 NOTE — Telephone Encounter (Signed)
Forms faxed and placed in scan  

## 2023-01-11 NOTE — Telephone Encounter (Signed)
Roseanne Reno PT sent forms to be signed placed in Dr Beverely Low to be signed folder

## 2023-01-12 DIAGNOSIS — M25552 Pain in left hip: Secondary | ICD-10-CM | POA: Diagnosis not present

## 2023-01-14 DIAGNOSIS — M25552 Pain in left hip: Secondary | ICD-10-CM | POA: Diagnosis not present

## 2023-01-18 DIAGNOSIS — M25552 Pain in left hip: Secondary | ICD-10-CM | POA: Diagnosis not present

## 2023-01-18 DIAGNOSIS — F4310 Post-traumatic stress disorder, unspecified: Secondary | ICD-10-CM | POA: Diagnosis not present

## 2023-01-29 DIAGNOSIS — F419 Anxiety disorder, unspecified: Secondary | ICD-10-CM | POA: Diagnosis not present

## 2023-02-01 DIAGNOSIS — F4310 Post-traumatic stress disorder, unspecified: Secondary | ICD-10-CM | POA: Diagnosis not present

## 2023-02-15 DIAGNOSIS — F4310 Post-traumatic stress disorder, unspecified: Secondary | ICD-10-CM | POA: Diagnosis not present

## 2023-02-26 DIAGNOSIS — F419 Anxiety disorder, unspecified: Secondary | ICD-10-CM | POA: Diagnosis not present

## 2023-03-29 DIAGNOSIS — F4310 Post-traumatic stress disorder, unspecified: Secondary | ICD-10-CM | POA: Diagnosis not present

## 2023-04-12 DIAGNOSIS — F4310 Post-traumatic stress disorder, unspecified: Secondary | ICD-10-CM | POA: Diagnosis not present

## 2023-04-23 DIAGNOSIS — F419 Anxiety disorder, unspecified: Secondary | ICD-10-CM | POA: Diagnosis not present

## 2023-04-26 DIAGNOSIS — F4310 Post-traumatic stress disorder, unspecified: Secondary | ICD-10-CM | POA: Diagnosis not present

## 2023-05-10 DIAGNOSIS — F4310 Post-traumatic stress disorder, unspecified: Secondary | ICD-10-CM | POA: Diagnosis not present

## 2023-05-14 DIAGNOSIS — F419 Anxiety disorder, unspecified: Secondary | ICD-10-CM | POA: Diagnosis not present

## 2023-05-24 DIAGNOSIS — F4310 Post-traumatic stress disorder, unspecified: Secondary | ICD-10-CM | POA: Diagnosis not present

## 2023-05-26 ENCOUNTER — Other Ambulatory Visit: Payer: Self-pay | Admitting: Family Medicine

## 2023-05-26 NOTE — Telephone Encounter (Signed)
Patient is requesting a refill of the following medications: Requested Prescriptions   Pending Prescriptions Disp Refills   pramipexole (MIRAPEX) 0.75 MG tablet [Pharmacy Med Name: PRAMIPEXOLE DIHYDRO TABS 0.75MG ] 90 tablet 3    Sig: TAKE 1 TABLET AT BEDTIME    Date of patient request: 05/26/23 Last office visit: 12/30/22 Date of last refill: 12/15/22 Last refill amount: 90 Follow up time period per chart: 06/30/23

## 2023-06-07 DIAGNOSIS — F4310 Post-traumatic stress disorder, unspecified: Secondary | ICD-10-CM | POA: Diagnosis not present

## 2023-06-21 DIAGNOSIS — F4310 Post-traumatic stress disorder, unspecified: Secondary | ICD-10-CM | POA: Diagnosis not present

## 2023-06-30 ENCOUNTER — Encounter: Payer: Self-pay | Admitting: Family Medicine

## 2023-06-30 ENCOUNTER — Ambulatory Visit: Payer: BC Managed Care – PPO | Admitting: Family Medicine

## 2023-06-30 VITALS — BP 104/74 | HR 74 | Temp 98.0°F | Ht 62.5 in | Wt 207.0 lb

## 2023-06-30 DIAGNOSIS — Z0000 Encounter for general adult medical examination without abnormal findings: Secondary | ICD-10-CM | POA: Diagnosis not present

## 2023-06-30 DIAGNOSIS — Z136 Encounter for screening for cardiovascular disorders: Secondary | ICD-10-CM | POA: Diagnosis not present

## 2023-06-30 DIAGNOSIS — E669 Obesity, unspecified: Secondary | ICD-10-CM | POA: Diagnosis not present

## 2023-06-30 LAB — HEPATIC FUNCTION PANEL
ALT: 31 U/L (ref 0–35)
AST: 23 U/L (ref 0–37)
Albumin: 4.6 g/dL (ref 3.5–5.2)
Alkaline Phosphatase: 66 U/L (ref 39–117)
Bilirubin, Direct: 0.1 mg/dL (ref 0.0–0.3)
Total Bilirubin: 0.3 mg/dL (ref 0.2–1.2)
Total Protein: 8.1 g/dL (ref 6.0–8.3)

## 2023-06-30 LAB — CBC WITH DIFFERENTIAL/PLATELET
Basophils Absolute: 0 10*3/uL (ref 0.0–0.1)
Basophils Relative: 0.3 % (ref 0.0–3.0)
Eosinophils Absolute: 0.1 10*3/uL (ref 0.0–0.7)
Eosinophils Relative: 1 % (ref 0.0–5.0)
HCT: 42.5 % (ref 36.0–46.0)
Hemoglobin: 13.7 g/dL (ref 12.0–15.0)
Lymphocytes Relative: 21.6 % (ref 12.0–46.0)
Lymphs Abs: 1.9 10*3/uL (ref 0.7–4.0)
MCHC: 32.2 g/dL (ref 30.0–36.0)
MCV: 90.9 fL (ref 78.0–100.0)
Monocytes Absolute: 0.5 10*3/uL (ref 0.1–1.0)
Monocytes Relative: 6.2 % (ref 3.0–12.0)
Neutro Abs: 6.2 10*3/uL (ref 1.4–7.7)
Neutrophils Relative %: 70.9 % (ref 43.0–77.0)
Platelets: 354 10*3/uL (ref 150.0–400.0)
RBC: 4.67 Mil/uL (ref 3.87–5.11)
RDW: 13.9 % (ref 11.5–15.5)
WBC: 8.8 10*3/uL (ref 4.0–10.5)

## 2023-06-30 LAB — TSH: TSH: 1.92 u[IU]/mL (ref 0.35–5.50)

## 2023-06-30 LAB — BASIC METABOLIC PANEL
BUN: 17 mg/dL (ref 6–23)
CO2: 28 meq/L (ref 19–32)
Calcium: 10 mg/dL (ref 8.4–10.5)
Chloride: 100 meq/L (ref 96–112)
Creatinine, Ser: 1.08 mg/dL (ref 0.40–1.20)
GFR: 67.5 mL/min (ref >60.00)
Glucose, Bld: 89 mg/dL (ref 70–99)
Potassium: 4.2 meq/L (ref 3.5–5.1)
Sodium: 137 meq/L (ref 135–145)

## 2023-06-30 LAB — LIPID PANEL
Cholesterol: 189 mg/dL (ref 0–200)
HDL: 50.3 mg/dL (ref >39.00)
LDL Cholesterol: 122 mg/dL — ABNORMAL HIGH (ref 0–99)
NonHDL: 138.21
Total CHOL/HDL Ratio: 4
Triglycerides: 79 mg/dL (ref 0.0–149.0)
VLDL: 15.8 mg/dL (ref 0.0–40.0)

## 2023-06-30 LAB — VITAMIN D 25 HYDROXY (VIT D DEFICIENCY, FRACTURES): VITD: 43.9 ng/mL (ref 30.00–100.00)

## 2023-06-30 NOTE — Assessment & Plan Note (Signed)
Pt's PE WNL w/ exception of BMI.  UTD on pap, Tdap, flu.  Check labs.  Anticipatory guidance provided.  

## 2023-06-30 NOTE — Patient Instructions (Addendum)
Follow up in 1 year or as needed We'll notify you of your lab results and make any changes if needed Continue to work on healthy diet and regular exercise- you can do it! Call with any questions or concerns Stay Safe!  Stay Healthy! Happy Fall!! 

## 2023-06-30 NOTE — Progress Notes (Signed)
   Subjective:    Patient ID: Stacie Rosario, female    DOB: [birth year], 32 y.o.   MRN: [id number]  HPI CPE- UTD on Tdap, pap, flu  Patient Care Team    Relationship Specialty Notifications Start End  Sheliah Hatch, MD PCP - General Family Medicine  11/24/12   Dermatology, [facility]    06/30/23      Health Maintenance  Topic Date Due   DTaP/Tdap/Td (3 - Td or Tdap) 01/22/2024   Cervical Cancer Screening (HPV/Pap Cotest)  06/25/2027   INFLUENZA VACCINE  Completed   COVID-19 Vaccine  Completed   Hepatitis C Screening  Completed   HIV Screening  Completed   HPV VACCINES  Aged Out      Review of Systems Patient reports no vision/ hearing changes, adenopathy,fever, weight change,  persistant/recurrent hoarseness , swallowing issues, chest pain, palpitations, edema, persistant/recurrent cough, hemoptysis, dyspnea (rest/exertional/paroxysmal nocturnal), gastrointestinal bleeding (melena, rectal bleeding), abdominal pain, significant heartburn, bowel changes, GU symptoms (dysuria, hematuria, incontinence), Gyn symptoms (abnormal  bleeding, pain),  syncope, focal weakness, memory loss, numbness & tingling, skin/hair/nail changes, abnormal bruising or bleeding, anxiety, or depression.     Objective:   Physical Exam General Appearance:    Alert, cooperative, no distress, appears stated age, obese  Head:    Normocephalic, without obvious abnormality, atraumatic  Eyes:    PERRL, conjunctiva/corneas clear, EOM's intact both eyes  Ears:    Normal TM's and external ear canals, both ears  Nose:   Nares normal, septum midline, mucosa normal, no drainage    or sinus tenderness  Throat:   Lips, mucosa, and tongue normal; teeth and gums normal  Neck:   Supple, symmetrical, trachea midline, no adenopathy;    Thyroid: no enlargement/tenderness/nodules  Back:     Symmetric, no curvature, ROM normal, no CVA tenderness  Lungs:     Clear to auscultation bilaterally, respirations unlabored   Chest Wall:    No tenderness or deformity   Heart:    Regular rate and rhythm, S1 and S2 normal, no murmur, rub   or gallop  Breast Exam:    Deferred to GYN  Abdomen:     Soft, non-tender, bowel sounds active all four quadrants,    no masses, no organomegaly  Genitalia:    Deferred to GYN  Rectal:    Extremities:   Extremities normal, atraumatic, no cyanosis or edema  Pulses:   2+ and symmetric all extremities  Skin:   Skin color, texture, turgor normal, no rashes or lesions  Lymph nodes:   Cervical, supraclavicular, and axillary nodes normal  Neurologic:   CNII-XII intact, normal strength, sensation and reflexes    throughout          Assessment & Plan:

## 2023-07-05 DIAGNOSIS — F4310 Post-traumatic stress disorder, unspecified: Secondary | ICD-10-CM | POA: Diagnosis not present

## 2023-07-19 DIAGNOSIS — F4310 Post-traumatic stress disorder, unspecified: Secondary | ICD-10-CM | POA: Diagnosis not present

## 2023-08-02 DIAGNOSIS — F4310 Post-traumatic stress disorder, unspecified: Secondary | ICD-10-CM | POA: Diagnosis not present

## 2023-08-09 ENCOUNTER — Other Ambulatory Visit: Payer: Self-pay | Admitting: Family Medicine

## 2023-08-09 DIAGNOSIS — J302 Other seasonal allergic rhinitis: Secondary | ICD-10-CM

## 2023-08-16 DIAGNOSIS — F4310 Post-traumatic stress disorder, unspecified: Secondary | ICD-10-CM | POA: Diagnosis not present

## 2023-09-13 DIAGNOSIS — F4310 Post-traumatic stress disorder, unspecified: Secondary | ICD-10-CM | POA: Diagnosis not present

## 2023-09-20 DIAGNOSIS — F4310 Post-traumatic stress disorder, unspecified: Secondary | ICD-10-CM | POA: Diagnosis not present

## 2023-09-27 DIAGNOSIS — F4310 Post-traumatic stress disorder, unspecified: Secondary | ICD-10-CM | POA: Diagnosis not present

## 2023-10-11 DIAGNOSIS — F4310 Post-traumatic stress disorder, unspecified: Secondary | ICD-10-CM | POA: Diagnosis not present

## 2023-11-08 DIAGNOSIS — F4310 Post-traumatic stress disorder, unspecified: Secondary | ICD-10-CM | POA: Diagnosis not present

## 2023-11-10 ENCOUNTER — Other Ambulatory Visit: Payer: Self-pay | Admitting: Family Medicine

## 2023-11-10 DIAGNOSIS — L700 Acne vulgaris: Secondary | ICD-10-CM | POA: Diagnosis not present

## 2023-11-10 DIAGNOSIS — F418 Other specified anxiety disorders: Secondary | ICD-10-CM

## 2023-11-10 DIAGNOSIS — D224 Melanocytic nevi of scalp and neck: Secondary | ICD-10-CM | POA: Diagnosis not present

## 2023-11-10 MED ORDER — BUPROPION HCL ER (XL) 150 MG PO TB24: 150.0000 mg | ORAL_TABLET | Freq: Every day | ORAL | 3 refills | Status: DC

## 2023-11-10 NOTE — Telephone Encounter (Signed)
 Copied from CRM [id number]. Topic: Clinical - Medication Refill >> Nov 10, 2023 11:59 AM Marica Otter wrote: Most Recent Primary Care Visit:  Provider: Sheliah Hatch  Department: [facility]  Visit Type: PHYSICAL  Date: 06/30/2023  Medication: buPROPion (WELLBUTRIN XL) 150 MG 24 hr tablet  Has the patient contacted their pharmacy? No, says prescription expired  (Agent: If no, request that the patient contact the pharmacy for the refill. If patient does not wish to contact the pharmacy document the reason why and proceed with request.) (Agent: If yes, when and what did the pharmacy advise?)  Is this the correct pharmacy for this prescription? Yes If no, delete pharmacy and type the correct one.  This is the patient's preferred pharmacy:  [id number] - [place], [place] - [address] AT [facility] [address] [place] [address] Phone: [phone] Fax: [phone]    Has the prescription been filled recently? No  Is the patient out of the medication? Yes  Has the patient been seen for an appointment in the last year OR does the patient have an upcoming appointment? Yes  Can we respond through MyChart? Yes  Agent: Please be advised that Rx refills may take up to 3 business days. We ask that you follow-up with your pharmacy.

## 2023-11-22 DIAGNOSIS — F4310 Post-traumatic stress disorder, unspecified: Secondary | ICD-10-CM | POA: Diagnosis not present

## 2023-12-06 DIAGNOSIS — F4310 Post-traumatic stress disorder, unspecified: Secondary | ICD-10-CM | POA: Diagnosis not present

## 2024-01-31 DIAGNOSIS — F4310 Post-traumatic stress disorder, unspecified: Secondary | ICD-10-CM | POA: Diagnosis not present

## 2024-02-14 ENCOUNTER — Encounter: Admitting: Family Medicine

## 2024-03-10 ENCOUNTER — Encounter: Admitting: Family Medicine

## 2024-05-04 ENCOUNTER — Ambulatory Visit: Payer: Self-pay | Admitting: Family Medicine

## 2024-05-04 ENCOUNTER — Encounter: Payer: Self-pay | Admitting: Family Medicine

## 2024-05-04 ENCOUNTER — Ambulatory Visit: Admitting: Family Medicine

## 2024-05-04 VITALS — BP 114/82 | HR 93 | Temp 98.3°F | Resp 21 | Ht 62.3 in | Wt 188.8 lb

## 2024-05-04 DIAGNOSIS — Z202 Contact with and (suspected) exposure to infections with a predominantly sexual mode of transmission: Secondary | ICD-10-CM | POA: Diagnosis not present

## 2024-05-04 DIAGNOSIS — Z0000 Encounter for general adult medical examination without abnormal findings: Secondary | ICD-10-CM | POA: Diagnosis not present

## 2024-05-04 DIAGNOSIS — E669 Obesity, unspecified: Secondary | ICD-10-CM | POA: Diagnosis not present

## 2024-05-04 DIAGNOSIS — F3342 Major depressive disorder, recurrent, in full remission: Secondary | ICD-10-CM | POA: Insufficient documentation

## 2024-05-04 DIAGNOSIS — N951 Menopausal and female climacteric states: Secondary | ICD-10-CM | POA: Insufficient documentation

## 2024-05-04 LAB — CBC WITH DIFFERENTIAL/PLATELET
Basophils Absolute: 0 K/uL (ref 0.0–0.1)
Basophils Relative: 0.3 % (ref 0.0–3.0)
Eosinophils Absolute: 0.1 K/uL (ref 0.0–0.7)
Eosinophils Relative: 1.1 % (ref 0.0–5.0)
HCT: 41 % (ref 36.0–46.0)
Hemoglobin: 13.6 g/dL (ref 12.0–15.0)
Lymphocytes Relative: 21.5 % (ref 12.0–46.0)
Lymphs Abs: 1.4 K/uL (ref 0.7–4.0)
MCHC: 33.1 g/dL (ref 30.0–36.0)
MCV: 91.2 fl (ref 78.0–100.0)
Monocytes Absolute: 0.4 K/uL (ref 0.1–1.0)
Monocytes Relative: 5.6 % (ref 3.0–12.0)
Neutro Abs: 4.5 K/uL (ref 1.4–7.7)
Neutrophils Relative %: 71.5 % (ref 43.0–77.0)
Platelets: 280 K/uL (ref 150.0–400.0)
RBC: 4.5 Mil/uL (ref 3.87–5.11)
RDW: 13.8 % (ref 11.5–15.5)
WBC: 6.3 K/uL (ref 4.0–10.5)

## 2024-05-04 LAB — BASIC METABOLIC PANEL WITH GFR
BUN: 11 mg/dL (ref 6–23)
CO2: 30 meq/L (ref 19–32)
Calcium: 10 mg/dL (ref 8.4–10.5)
Chloride: 100 meq/L (ref 96–112)
Creatinine, Ser: 0.95 mg/dL (ref 0.40–1.20)
GFR: 78.26 mL/min (ref >60.00)
Glucose, Bld: 90 mg/dL (ref 70–99)
Potassium: 4.4 meq/L (ref 3.5–5.1)
Sodium: 137 meq/L (ref 135–145)

## 2024-05-04 LAB — VITAMIN D 25 HYDROXY (VIT D DEFICIENCY, FRACTURES): VITD: 38.79 ng/mL (ref 30.00–100.00)

## 2024-05-04 LAB — LIPID PANEL
Cholesterol: 188 mg/dL (ref 0–200)
HDL: 49.4 mg/dL (ref >39.00)
LDL Cholesterol: 116 mg/dL — ABNORMAL HIGH (ref 0–99)
NonHDL: 138.61
Total CHOL/HDL Ratio: 4
Triglycerides: 114 mg/dL (ref 0.0–149.0)
VLDL: 22.8 mg/dL (ref 0.0–40.0)

## 2024-05-04 LAB — HEPATIC FUNCTION PANEL
ALT: 14 U/L (ref 0–35)
AST: 17 U/L (ref 0–37)
Albumin: 4.6 g/dL (ref 3.5–5.2)
Alkaline Phosphatase: 55 U/L (ref 39–117)
Bilirubin, Direct: 0.1 mg/dL (ref 0.0–0.3)
Total Bilirubin: 0.3 mg/dL (ref 0.2–1.2)
Total Protein: 7.9 g/dL (ref 6.0–8.3)

## 2024-05-04 LAB — TSH: TSH: 1.49 u[IU]/mL (ref 0.35–5.50)

## 2024-05-04 NOTE — Progress Notes (Signed)
   Subjective:    Patient ID: Stacie Rosario, female    DOB: 1991-08-03, 33 y.o.   MRN: [id number]  HPI CPE- UTD on pap, Tdap, flu.  No concerns today- would like routine STD screening  Patient Care Team    Relationship Specialty Notifications Start End  Mahlon Comer BRAVO, MD PCP - General Family Medicine  11/24/12   Dermatology, [facility]    06/30/23     Health Maintenance  Topic Date Due   Pneumococcal Vaccine (1 of 2 - PCV) Never done   Hepatitis B Vaccines 19-59 Average Risk (1 of 3 - 19+ 3-dose series) Never done   Cervical Cancer Screening (HPV/Pap Cotest)  06/25/2027   DTaP/Tdap/Td (4 - Td or Tdap) 04/30/2034   INFLUENZA VACCINE  Completed   HPV VACCINES  Completed   COVID-19 Vaccine  Completed   Hepatitis C Screening  Completed   HIV Screening  Completed   Meningococcal B Vaccine  Aged Out      Review of Systems Patient reports no vision/ hearing changes, adenopathy,fever, persistant/recurrent hoarseness , swallowing issues, chest pain, palpitations, edema, persistant/recurrent cough, hemoptysis, dyspnea (rest/exertional/paroxysmal nocturnal), gastrointestinal bleeding (melena, rectal bleeding), abdominal pain, significant heartburn, bowel changes, GU symptoms (dysuria, hematuria, incontinence), Gyn symptoms (abnormal  bleeding, pain),  syncope, focal weakness, memory loss, numbness & tingling, skin/hair/nail changes, abnormal bruising or bleeding, anxiety, or depression.   + 20 lb weight loss- pt is going to the gym Mon-Fri, eating healthy    Objective:   Physical Exam General Appearance:    Alert, cooperative, no distress, appears stated age  Head:    Normocephalic, without obvious abnormality, atraumatic  Eyes:    PERRL, conjunctiva/corneas clear, EOM's intact both eyes  Ears:    Normal TM's and external ear canals, both ears  Nose:   Nares normal, septum midline, mucosa normal, no drainage    or sinus tenderness  Throat:   Lips, mucosa, and tongue normal;  teeth and gums normal  Neck:   Supple, symmetrical, trachea midline, no adenopathy;    Thyroid : no enlargement/tenderness/nodules  Back:     Symmetric, no curvature, ROM normal, no CVA tenderness  Lungs:     Clear to auscultation bilaterally, respirations unlabored  Chest Wall:    No tenderness or deformity   Heart:    Regular rate and rhythm, S1 and S2 normal, no murmur, rub   or gallop  Breast Exam:    Deferred to GYN  Abdomen:     Soft, non-tender, bowel sounds active all four quadrants,    no masses, no organomegaly  Genitalia:    Deferred to GYN  Rectal:    Extremities:   Extremities normal, atraumatic, no cyanosis or edema  Pulses:   2+ and symmetric all extremities  Skin:   Skin color, texture, turgor normal, no rashes or lesions  Lymph nodes:   Cervical, supraclavicular, and axillary nodes normal  Neurologic:   CNII-XII intact, normal strength, sensation and reflexes    throughout          Assessment & Plan:

## 2024-05-04 NOTE — Patient Instructions (Signed)
Follow up in 1 year or as needed We'll notify you of your lab results and make any changes if needed Keep up the good work on healthy diet and regular exercise- you look great! Call with any questions or concerns Stay Safe!  Stay Healthy! Happy Fall!!!

## 2024-05-05 LAB — SURESWAB® ADVANCED VAGINITIS PLUS,TMA
C. trachomatis RNA, TMA: NOT DETECTED
CANDIDA SPECIES: NOT DETECTED
Candida glabrata: NOT DETECTED
N. gonorrhoeae RNA, TMA: NOT DETECTED
SURESWAB(R) ADV BACTERIAL VAGINOSIS(BV),TMA: NEGATIVE
TRICHOMONAS VAGINALIS (TV),TMA: NOT DETECTED

## 2024-05-05 LAB — HIV ANTIBODY (ROUTINE TESTING W REFLEX): HIV 1&2 Ab, 4th Generation: NONREACTIVE

## 2024-05-05 LAB — RPR: RPR Ser Ql: NONREACTIVE

## 2024-05-14 NOTE — Assessment & Plan Note (Signed)
 Pt's PE WNL.  UTD on pap, Tdap flu.  Check labs.  Anticipatory guidance provided.

## 2024-05-14 NOTE — Assessment & Plan Note (Signed)
 20 lb weight loss!  Applauded her efforts at healthy diet and regular exercise.  Check labs to risk stratify.  Will follow.

## 2024-05-15 ENCOUNTER — Encounter: Payer: Self-pay | Admitting: Family Medicine

## 2024-05-15 NOTE — Telephone Encounter (Signed)
 Patient asking for a letter of medical necessity for a dehumidifier, please advise

## 2024-06-30 ENCOUNTER — Encounter: Payer: BC Managed Care – PPO | Admitting: Family Medicine

## 2024-07-13 ENCOUNTER — Encounter: Payer: BC Managed Care – PPO | Admitting: Family Medicine

## 2024-09-10 ENCOUNTER — Encounter: Payer: Self-pay | Admitting: Family Medicine

## 2024-09-10 DIAGNOSIS — J302 Other seasonal allergic rhinitis: Secondary | ICD-10-CM

## 2024-09-11 MED ORDER — MONTELUKAST SODIUM 10 MG PO TABS: ORAL_TABLET | ORAL | 3 refills | Status: AC

## 2024-09-11 MED ORDER — PRAMIPEXOLE DIHYDROCHLORIDE 0.75 MG PO TABS: 0.7500 mg | ORAL_TABLET | Freq: Every day | ORAL | 3 refills | Status: AC

## 2024-09-17 ENCOUNTER — Other Ambulatory Visit: Payer: Self-pay | Admitting: Family Medicine

## 2024-09-17 DIAGNOSIS — F418 Other specified anxiety disorders: Secondary | ICD-10-CM
